# Patient Record
Sex: Female | Born: 1970
Health system: Southern US, Community
[De-identification: ages and names within clinical notes are randomized; demographics above are authoritative.]

## PROBLEM LIST (undated history)

## (undated) DIAGNOSIS — I10 Essential (primary) hypertension: Secondary | ICD-10-CM

## (undated) HISTORY — DX: Essential (primary) hypertension: I10

---

## 2007-03-16 ENCOUNTER — Emergency Department (HOSPITAL_COMMUNITY): Admission: EM | Admit: 2007-03-16 | Discharge: 2007-03-16 | Payer: Self-pay | Admitting: Family Medicine

## 2009-06-09 ENCOUNTER — Emergency Department (HOSPITAL_COMMUNITY)
Admission: EM | Admit: 2009-06-09 | Discharge: 2009-06-09 | Payer: Self-pay | Source: Home / Self Care | Admitting: Family Medicine

## 2009-06-11 ENCOUNTER — Emergency Department (HOSPITAL_COMMUNITY)
Admission: EM | Admit: 2009-06-11 | Discharge: 2009-06-11 | Payer: Self-pay | Source: Home / Self Care | Admitting: Emergency Medicine

## 2009-06-24 ENCOUNTER — Emergency Department (HOSPITAL_COMMUNITY)
Admission: EM | Admit: 2009-06-24 | Discharge: 2009-06-24 | Payer: Self-pay | Source: Home / Self Care | Admitting: Family Medicine

## 2009-12-11 ENCOUNTER — Encounter: Admission: RE | Admit: 2009-12-11 | Discharge: 2009-12-11 | Payer: Self-pay | Admitting: Internal Medicine

## 2011-02-02 LAB — POCT URINALYSIS DIP (DEVICE)
Nitrite: NEGATIVE
Operator id: 239701
Specific Gravity, Urine: 1.02

## 2011-02-02 LAB — DIFFERENTIAL
Eosinophils Absolute: 0.2
Lymphocytes Relative: 17
Monocytes Relative: 5
Neutrophils Relative %: 76

## 2011-02-02 LAB — POCT PREGNANCY, URINE: Preg Test, Ur: NEGATIVE

## 2011-02-02 LAB — CBC
Platelets: 201
WBC: 10.9 — ABNORMAL HIGH

## 2011-05-02 ENCOUNTER — Emergency Department (HOSPITAL_COMMUNITY)
Admission: EM | Admit: 2011-05-02 | Discharge: 2011-05-02 | Disposition: A | Discharge status: Home / Self Care | Payer: No Typology Code available for payment source | Attending: Emergency Medicine | Admitting: Emergency Medicine

## 2011-05-02 ENCOUNTER — Emergency Department (HOSPITAL_COMMUNITY): Payer: No Typology Code available for payment source

## 2011-05-02 ENCOUNTER — Encounter: Payer: Self-pay | Admitting: Emergency Medicine

## 2011-05-02 DIAGNOSIS — Y92410 Unspecified street and highway as the place of occurrence of the external cause: Secondary | ICD-10-CM | POA: Insufficient documentation

## 2011-05-02 DIAGNOSIS — S0003XA Contusion of scalp, initial encounter: Secondary | ICD-10-CM | POA: Insufficient documentation

## 2011-05-02 DIAGNOSIS — M542 Cervicalgia: Secondary | ICD-10-CM | POA: Insufficient documentation

## 2011-05-02 DIAGNOSIS — S0083XA Contusion of other part of head, initial encounter: Secondary | ICD-10-CM

## 2011-05-02 DIAGNOSIS — S0990XA Unspecified injury of head, initial encounter: Secondary | ICD-10-CM | POA: Insufficient documentation

## 2011-05-02 DIAGNOSIS — T07XXXA Unspecified multiple injuries, initial encounter: Secondary | ICD-10-CM | POA: Insufficient documentation

## 2011-05-02 DIAGNOSIS — S01409A Unspecified open wound of unspecified cheek and temporomandibular area, initial encounter: Secondary | ICD-10-CM | POA: Insufficient documentation

## 2011-05-02 MED ORDER — TETANUS-DIPHTH-ACELL PERTUSSIS 5-2.5-18.5 LF-MCG/0.5 IM SUSP
0.5000 mL | Freq: Once | INTRAMUSCULAR | Status: AC
  Administered 2011-05-02: 0.5 mL via INTRAMUSCULAR
  Filled 2011-05-02: qty 0.5

## 2011-05-02 MED ORDER — OXYCODONE-ACETAMINOPHEN 5-325 MG PO TABS: 2.0000 | ORAL_TABLET | Freq: Once | ORAL | Status: DC

## 2011-05-02 MED ORDER — LIDOCAINE-EPINEPHRINE 2 %-1:100000 IJ SOLN
20.0000 mL | Freq: Once | INTRAMUSCULAR | Status: AC
  Administered 2011-05-02: 20 mL via INTRADERMAL
  Filled 2011-05-02: qty 1

## 2011-05-02 MED ORDER — OXYCODONE-ACETAMINOPHEN 5-325 MG PO TABS
2.0000 | ORAL_TABLET | Freq: Once | ORAL | Status: AC
  Administered 2011-05-02: 2 via ORAL
  Filled 2011-05-02: qty 2

## 2011-05-02 MED ORDER — OXYCODONE-ACETAMINOPHEN 5-325 MG PO TABS: 1.0000 | ORAL_TABLET | ORAL | Status: DC | PRN

## 2011-05-02 MED ORDER — IBUPROFEN 200 MG PO TABS
400.0000 mg | ORAL_TABLET | Freq: Once | ORAL | Status: AC
  Administered 2011-05-02: 400 mg via ORAL
  Filled 2011-05-02: qty 2
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
## 2011-05-05 ENCOUNTER — Encounter (HOSPITAL_COMMUNITY): Payer: Self-pay | Admitting: *Deleted

## 2011-05-05 ENCOUNTER — Emergency Department (HOSPITAL_COMMUNITY)
Admission: EM | Admit: 2011-05-05 | Discharge: 2011-05-05 | Disposition: A | Discharge status: Home / Self Care | Payer: No Typology Code available for payment source | Attending: Emergency Medicine | Admitting: Emergency Medicine

## 2011-05-05 DIAGNOSIS — M549 Dorsalgia, unspecified: Secondary | ICD-10-CM | POA: Insufficient documentation

## 2011-05-05 MED ORDER — ONDANSETRON 4 MG PO TBDP
4.0000 mg | ORAL_TABLET | Freq: Once | ORAL | Status: AC
  Administered 2011-05-05: 4 mg via ORAL
  Filled 2011-05-05: qty 1

## 2011-05-05 MED ORDER — OXYCODONE-ACETAMINOPHEN 5-325 MG PO TABS: 2.0000 | ORAL_TABLET | ORAL | Status: AC | PRN

## 2011-05-05 MED ORDER — MORPHINE SULFATE 4 MG/ML IJ SOLN
4.0000 mg | Freq: Once | INTRAMUSCULAR | Status: AC
  Administered 2011-05-05: 4 mg via INTRAMUSCULAR
  Filled 2011-05-05: qty 1
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
## 2011-05-08 ENCOUNTER — Emergency Department (HOSPITAL_COMMUNITY)
Admission: EM | Admit: 2011-05-08 | Discharge: 2011-05-08 | Disposition: A | Discharge status: Home / Self Care | Payer: No Typology Code available for payment source | Attending: Emergency Medicine | Admitting: Emergency Medicine

## 2011-05-08 ENCOUNTER — Encounter (HOSPITAL_COMMUNITY): Payer: Self-pay

## 2011-05-08 DIAGNOSIS — Z4802 Encounter for removal of sutures: Secondary | ICD-10-CM

## 2011-05-08 MED ORDER — BACITRACIN-NEOMYCIN-POLYMYXIN 400-5-5000 EX OINT
TOPICAL_OINTMENT | CUTANEOUS | Status: AC
  Filled 2011-05-08: qty 1

## 2011-05-08 MED ORDER — IBUPROFEN 800 MG PO TABS: 800.0000 mg | ORAL_TABLET | Freq: Three times a day (TID) | ORAL | Status: AC

## 2011-05-08 NOTE — ED Provider Notes (Signed)
History     CSN: [id number]  Arrival date & time 05/08/11  1038   First MD Initiated Contact with Patient 05/08/11 1040      No chief complaint on file.   (Consider location/radiation/quality/duration/timing/severity/associated sxs/prior treatment) Patient is a 41 y.o. female presenting with suture removal. The history is provided by the patient.  Suture / Staple Removal  Treated in ED: 6 days ago. There has been no drainage from the wound. There is no redness present. There is no swelling present. The pain has no pain.    No past medical history on file.  No past surgical history on file.  No family history on file.  History  Substance Use Topics  . Smoking status: Not on file  . Smokeless tobacco: Not on file  . Alcohol Use:     OB History    Grav Para Term Preterm Abortions TAB SAB Ect Mult Living                  Review of Systems  Constitutional: Negative for fever and chills.    Allergies  Review of patient's allergies indicates no known allergies.  Home Medications   Current Outpatient Rx  Name Route Sig Dispense Refill  . OXYCODONE-ACETAMINOPHEN 5-325 MG PO TABS Oral Take 1 tablet by mouth every 4 (four) hours as needed for pain. 10 tablet 0  . OXYCODONE-ACETAMINOPHEN 5-325 MG PO TABS Oral Take 2 tablets by mouth every 4 (four) hours as needed for pain. 15 tablet 0    BP 111/74  Pulse 89  Temp(Src) 98.8 F (37.1 C) (Oral)  Resp 18  SpO2 100%  Physical Exam  Constitutional: She is oriented to person, place, and time. She appears well-developed and well-nourished. No distress.  HENT:  Head: Normocephalic.    Neck: Normal range of motion. Neck supple.  Cardiovascular: Normal rate, regular rhythm and normal heart sounds.   Pulmonary/Chest: Effort normal and breath sounds normal.  Neurological: She is alert and oriented to person, place, and time.  Skin: Skin is warm and dry. She is not diaphoretic.  Psychiatric: She has a normal mood and  affect.    ED Course  Procedures (including critical care time)  Labs Reviewed - No data to display No results found.   1. Visit for suture removal       MDM  Sutures removed.  No signs of infection.  No fevers.  Patient instructed to apply antibiotic ointment to the area.        Pascal Lux [facility] 05/08/11 1643
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
## 2011-05-09 NOTE — ED Provider Notes (Signed)
Medical screening examination/treatment/procedure(s) were performed by non-physician practitioner and as supervising physician I was immediately available for consultation/collaboration.   Suzi Roots, MD 05/09/11 [phone]

## 2011-05-10 ENCOUNTER — Emergency Department (HOSPITAL_COMMUNITY): Payer: No Typology Code available for payment source

## 2011-05-10 ENCOUNTER — Encounter (HOSPITAL_COMMUNITY): Payer: Self-pay | Admitting: Emergency Medicine

## 2011-05-10 ENCOUNTER — Emergency Department (HOSPITAL_COMMUNITY)
Admission: EM | Admit: 2011-05-10 | Discharge: 2011-05-10 | Disposition: A | Discharge status: Home / Self Care | Payer: No Typology Code available for payment source | Attending: Emergency Medicine | Admitting: Emergency Medicine

## 2011-05-10 DIAGNOSIS — R51 Headache: Secondary | ICD-10-CM

## 2011-05-10 DIAGNOSIS — IMO0002 Reserved for concepts with insufficient information to code with codable children: Secondary | ICD-10-CM | POA: Insufficient documentation

## 2011-05-10 DIAGNOSIS — S39012A Strain of muscle, fascia and tendon of lower back, initial encounter: Secondary | ICD-10-CM

## 2011-05-10 DIAGNOSIS — T1490XA Injury, unspecified, initial encounter: Secondary | ICD-10-CM | POA: Insufficient documentation

## 2011-05-10 MED ORDER — TRAMADOL HCL 50 MG PO TABS: 50.0000 mg | ORAL_TABLET | Freq: Four times a day (QID) | ORAL | Status: AC | PRN
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
## 2013-04-10 ENCOUNTER — Ambulatory Visit (INDEPENDENT_AMBULATORY_CARE_PROVIDER_SITE_OTHER): Payer: BC Managed Care – PPO | Admitting: Family Medicine

## 2013-04-10 VITALS — BP 120/84 | HR 86 | Temp 98.2°F | Resp 18 | Ht 58.5 in | Wt 110.0 lb

## 2013-04-10 DIAGNOSIS — Z609 Problem related to social environment, unspecified: Secondary | ICD-10-CM

## 2013-04-10 DIAGNOSIS — Z789 Other specified health status: Secondary | ICD-10-CM

## 2013-04-10 DIAGNOSIS — Z758 Other problems related to medical facilities and other health care: Secondary | ICD-10-CM | POA: Insufficient documentation

## 2013-04-10 DIAGNOSIS — R5381 Other malaise: Secondary | ICD-10-CM

## 2013-04-10 DIAGNOSIS — R51 Headache: Secondary | ICD-10-CM

## 2013-04-10 LAB — POCT CBC
Granulocyte percent: 60.5 %G (ref 37–80)
Lymph, poc: 1.9 (ref 0.6–3.4)
MCV: 94.9 fL (ref 80–97)
MID (cbc): 0.4 (ref 0–0.9)
MPV: 10.8 fL (ref 0–99.8)
POC Granulocyte: 3.5 (ref 2–6.9)
RBC: 4.64 M/uL (ref 4.04–5.48)
WBC: 5.8 10*3/uL (ref 4.6–10.2)

## 2013-04-10 LAB — POCT UA - MICROSCOPIC ONLY
Casts, Ur, LPF, POC: NEGATIVE
RBC, urine, microscopic: NEGATIVE
Yeast, UA: NEGATIVE

## 2013-04-10 LAB — POCT URINALYSIS DIPSTICK
Blood, UA: NEGATIVE
Ketones, UA: NEGATIVE
Leukocytes, UA: NEGATIVE
Protein, UA: NEGATIVE
Urobilinogen, UA: 0.2

## 2013-04-10 LAB — POCT URINE PREGNANCY: Preg Test, Ur: NEGATIVE

## 2013-04-10 LAB — POCT GLYCOSYLATED HEMOGLOBIN (HGB A1C): Hemoglobin A1C: 4.7

## 2013-04-10 LAB — POCT INFLUENZA A/B: Influenza A, POC: NEGATIVE

## 2013-04-10 NOTE — Progress Notes (Signed)
[facility] [address] [place] [address] [phone]  Date:  04/10/2013   Name:  Molly Santos   DOB:  1970-11-19   MRN:  [id number]  PCP:  No primary provider on file.    Chief Complaint: Generalized Body Aches, Dizziness and Nausea   History of Present Illness:  Molly Santos is a 42 y.o. very pleasant female patient who presents with the following:  [place] lady here today with illness.  Complaint of HA, dizziness and body aches.  Sx started this am.   She does not note a ST or earache, no cough.   She has felt hot.  She also feels tired.  She feels "like she is sick", she has body aches and feels hot and cold.  She has felt nauseated but not vomited.  No diarrhea.   She has been able to eat.   Her daughter interprets for [place] today- states that her mother often has headaches.  However she does not have any chronic health problems that they know of.    She will take advil some times, but none so far today.   No sick contacts  LMP about 10 days ago  There are no active problems to display for this patient.   No past medical history on file.  No past surgical history on file.  History  Substance Use Topics  . Smoking status: Never Smoker   . Smokeless tobacco: Not on file  . Alcohol Use: Yes    No family history on file.  Allergies  Allergen Reactions  . Oxycodone Other (See Comments)    Gave her a headache    Medication list has been reviewed and updated.  No current outpatient prescriptions on file prior to visit.   No current facility-administered medications on file prior to visit.    Review of Systems:  As per HPI- otherwise negative.   Physical Examination: Filed Vitals:   04/10/13 1152  BP: 120/84  Pulse: 86  Temp: 98.2 F (36.8 C)  Resp: 18   Filed Vitals:   04/10/13 1152  Height: 4' 10.5" (1.486 m)  Weight: 110 lb (49.896 kg)   Body mass index is 22.6 kg/(m^2). Ideal Body Weight: Weight in (lb) to have BMI  = 25: 121.4  GEN: WDWN, NAD, Non-toxic, A & O x 3, looks well, healthy weight HEENT: Atraumatic, Normocephalic. Neck supple. No masses, No LAD.  Bilateral TM wnl, oropharynx normal.  PEERL,EOMI.   Ears and Nose: No external deformity. CV: RRR, No M/G/R. No JVD. No thrill. No extra heart sounds. PULM: CTA B, no wheezes, crackles, rhonchi. No retractions. No resp. distress. No accessory muscle use. ABD: S, NT, ND. No rebound. No HSM. EXTR: No c/c/e NEURO Normal gait. Normal strength, sensation and DTR all extremities.   PSYCH: Normally interactive. Conversant. Not depressed or anxious appearing.  Calm demeanor.   Results for orders placed in visit on 04/10/13  POCT CBC      Result Value Range   WBC 5.8  4.6 - 10.2 K/uL   Lymph, poc 1.9  0.6 - 3.4   POC LYMPH PERCENT 33.4  10 - 50 %L   MID (cbc) 0.4  0 - 0.9   POC MID % 6.1  0 - 12 %M   POC Granulocyte 3.5  2 - 6.9   Granulocyte percent 60.5  37 - 80 %G   RBC 4.64  4.04 - 5.48 M/uL   Hemoglobin 13.8  12.2 -  16.2 g/dL   HCT, POC 16.1  09.6 - 47.9 %   MCV 94.9  80 - 97 fL   MCH, POC 29.7  27 - 31.2 pg   MCHC 31.4 (*) 31.8 - 35.4 g/dL   RDW, POC 04.5     Platelet Count, POC 182  142 - 424 K/uL   MPV 10.8  0 - 99.8 fL  POCT INFLUENZA A/B      Result Value Range   Influenza A, POC Negative     Influenza B, POC Negative    POCT URINE PREGNANCY      Result Value Range   Preg Test, Ur Negative    POCT UA - MICROSCOPIC ONLY      Result Value Range   WBC, Ur, HPF, POC 0-4     RBC, urine, microscopic neg     Bacteria, U Microscopic trace     Mucus, UA neg     Epithelial cells, urine per micros 4-8     Crystals, Ur, HPF, POC neg     Casts, Ur, LPF, POC neg     Yeast, UA neg    POCT URINALYSIS DIPSTICK      Result Value Range   Color, UA yellow     Clarity, UA cloudy     Glucose, UA 100     Bilirubin, UA neg     Ketones, UA neg     Spec Grav, UA 1.015     Blood, UA neg     pH, UA 8.5     Protein, UA neg     Urobilinogen, UA  0.2     Nitrite, UA neg     Leukocytes, UA Negative    GLUCOSE, POCT (MANUAL RESULT ENTRY)      Result Value Range   POC Glucose 66 (*) 70 - 99 mg/dl  POCT GLYCOSYLATED HEMOGLOBIN (HGB A1C)      Result Value Range   Hemoglobin A1C 4.7     Glucose noted to be slightly low- she is not driving home, her husband is.  Given juice to drink.  Advised not to drive until she has a full meal   Assessment and Plan: Other malaise and fatigue - Plan: POCT CBC, POCT Influenza A/B, POCT urine pregnancy, POCT UA - Microscopic Only, POCT urinalysis dipstick, POCT glucose (manual entry), POCT glycosylated hemoglobin (Hb A1C)  Headache(784.0)  Language barrier  Likely viral URI.  She had a trace of glucose on her UA but blood glucose is normal.    Signed Abbe Amsterdam, MD

## 2013-04-10 NOTE — Patient Instructions (Signed)
It looks like you have a cold.  Rest, drink fluids and take ibuprofen or aleve as needed.  Let me know it you are not better in the next couple of days.

## 2013-07-08 ENCOUNTER — Ambulatory Visit (INDEPENDENT_AMBULATORY_CARE_PROVIDER_SITE_OTHER): Payer: BC Managed Care – PPO | Admitting: Emergency Medicine

## 2013-07-08 VITALS — BP 118/74 | HR 88 | Temp 98.2°F | Resp 18 | Ht <= 58 in | Wt 110.0 lb

## 2013-07-08 DIAGNOSIS — J209 Acute bronchitis, unspecified: Secondary | ICD-10-CM

## 2013-07-08 DIAGNOSIS — J029 Acute pharyngitis, unspecified: Secondary | ICD-10-CM

## 2013-07-08 MED ORDER — GUAIFENESIN-DM 100-10 MG/5ML PO SYRP: 5.0000 mL | ORAL_SOLUTION | ORAL | Status: DC | PRN

## 2013-07-08 MED ORDER — AZITHROMYCIN 250 MG PO TABS: ORAL_TABLET | ORAL | Status: DC

## 2013-07-08 NOTE — Patient Instructions (Signed)
Vim ph? qu?n (Bronchitis) Vim ph? qu?n l vim ???ng th? t? kh qu?n vo ph?i (ph? qu?n). Tnh tr?ng vim th??ng gy s?n sinh d?ch nh?y v d?n ??n ho. N?u vim tr? nn n?ng h?n, n c th? gy ra kh th?. NGUYN NHN.  Vim ph? qu?n c th? do:   Nhi?m vi rt  Vi khu?n.  Khi thu?c l.  D? ?ng nguyn, ch?t gy  nhi?m v cc ch?t gy kch ?ng khc. D?U HI?U V TRI?U CH?NG  Tri?u ch?ng ph? bi?n nh?t c?a vim ph? qu?n l ho th??ng xuyn ra d?ch nh?y. Cc tri?u ch?ng khc bao g?m:  S?t.  ?au nh?c ton thn.  T?c ng?c.  ?n l?nh.  Kh th?.  ?au h?ng. CH?N ?ON  Vim ph? qu?n th??ng ???c ch?n ?on thng qua b?nh s? v khm th?c th?. Nh?ng ki?m tra nh? ch?p X-quang ng?c ??i khi ???c th?c hi?n ?? lo?i tr? cc b?nh l khc.  ?I?U TR?  Qu v? c th? c?n trnh ti?p xc v?i t?t c? nh?ng g gy b?nh (v d? nh? khi thu?c). ?i khi c?n ph?i dng thu?c. Nh?ng thu?c ny c th? bao g?m:  Khng sinh. Thu?c khng sinh c th? ???c k ??n n?u tnh tr?ng b?nh do vi khu?n gy ra.  Thu?c gi?m ho. Nh?ng thu?c ny c th? ???c k ??n ?? gi?m tri?u ch?ng ho.  Thu?c d?ng ht. Nh?ng thu?c ny c th? ???c k ??n ?? gip khai thng ???ng th? v lm cho qu v? d? th? h?n.  Cc lo?i thu?c steroid. Nh?ng lo?i thu?c ny c th? ???c k ??n cho nh?ng ng??i b? vim ph? qu?n ti pht (m?n tnh) H??NG D?N CH?M SC T?I NH  Ngh? ng?i th?t nhi?u.  U?ng ?? n??c ?? n??c ti?u lun trong ho?c mu vng nh?t (tr? khi qu v? c b?nh l c?n ph?i h?n ch? u?ng n??c). T?ng l??ng n??c c th? gip lm l?ng ??m v s? ng?n ng?a m?t n??c.  Ch? s? d?ng thu?c khng c?n k ??n ho?c thu?c c?n k ??n theo ch? d?n c?a chuyn gia ch?m sc s?c kh?e.  Ch? dng thu?c khng sinh theo ch? d?n. B?o ??m vi?c qu v? dng h?t thu?c ngay c? khi qu v? b?t ??u c?m th?y ?? h?n.  Trnh khi thu?c th? ??ng, cc ch?t gy kch ?ng, v lu?ng khi m?nh. Nh?ng th? ny lm vim ph? qu?n n?ng h?n. N?u qu v? ht thu?c, hy b? ht thu?c. Cn nh?c s? d?ng  k?o cao su c nicotine ho?c mi?ng dn trn da ?? gip ki?m sot cc tri?u ch?ng sau cai. B? ht thu?c s? gip ph?i bnh ph?c nhanh h?n.  ?? m?t chi?c my t?o s??ng mt trong phng ng? vo ban ?m ?? lm ?m khng kh. Vi?c ny gip lm l?ng d?ch nh?y. Thay n??c trong my t?o s??ng hng ngy. Qu v? c?ng c th? cho n??c nng khi t?m vi hoa sen v ng?i trong phng t?m ?ng kn c?a trong 5-10 pht.  G?p chuyn gia ch?m sc s?c kh?e ?? khm l?i theo ch? d?n.  R?a tay th??ng xuyn ?? trnh m?c l?i b?nh vim ph? qu?n ho?c ly b?nh cho ng??i khc. ?I KHM N?U: Cc tri?u ch?ng c?a qu v? khng gi?m b?t sau 1 tu?n ?i?u tr?.  NGAY L?P T?C ?I KHM N?U:  C?n s?t c?a qu v? t?ng ln.  Qu v? b? ?n l?nh.  Qu v? b? ?au ng?c.  Qu v? b? kh   th? h?n.  Qu v? c ??m l?n mu.  Qu v? b? ng?t.  Qu v? b? chong vng.  Qu v? b? ?au ??u n?ng.  Qu v? nn nhi?u l?n. ??M B?O QU V?:   Hi?u r cc h??ng d?n ny.  S? theo di tnh tr?ng c?a mnh.  S? yu c?u tr? gip ngay l?p t?c n?u qu v? c?m th?y khng kh?e ho?c th?y tr?m tr?ng h?n. Document Released: 04/12/2005 Document Revised: 01/31/2013 ExitCare Patient Information 2014 [employer].  

## 2013-07-08 NOTE — Progress Notes (Signed)
[facility] [address], [place] [address] [phone]  Date:  07/08/2013   Name:  Molly Santos   DOB:  11/27/1970   MRN:  [id number]  PCP:  No PCP Per Patient    Chief Complaint: Sore Throat and Cough   History of Present Illness:  Molly Santos is a 43 y.o. very pleasant female patient who presents with the following:  Ill since yesterday.  She has a non productive cough with no wheezing or shortness of breath.  She has no nasal congestion or drainage.  No fever or chills.  No nausea or vomiting. No rash. Has a sore throat that interferes with her ability to swallow.  Has no sick contacts. No improvement with over the counter medications or other home remedies. Denies other complaint or health concern today.   Patient Active Problem List   Diagnosis Date Noted  . Language barrier 04/10/2013    History reviewed. No pertinent past medical history.  History reviewed. No pertinent past surgical history.  History  Substance Use Topics  . Smoking status: Never Smoker   . Smokeless tobacco: Not on file  . Alcohol Use: Yes    No family history on file.  Allergies  Allergen Reactions  . Oxycodone Other (See Comments)    Gave her a headache    Medication list has been reviewed and updated.  No current outpatient prescriptions on file prior to visit.   No current facility-administered medications on file prior to visit.    Review of Systems:  As per HPI, otherwise negative.    Physical Examination: Filed Vitals:   07/08/13 1745  BP: 118/74  Pulse: 88  Temp: 98.2 F (36.8 C)  Resp: 18   Filed Vitals:   07/08/13 1745  Height: 4\' 9"  (1.448 m)  Weight: 110 lb (49.896 kg)   Body mass index is 23.8 kg/(m^2). Ideal Body Weight: Weight in (lb) to have BMI = 25: 115.3  GEN: WDWN, NAD, Non-toxic, A & O x 3 HEENT: Atraumatic, Normocephalic. Neck supple. No masses, No LAD. Ears and Nose: No external deformity. CV: RRR, No M/G/R. No JVD. No thrill.  No extra heart sounds. PULM: CTA B, no wheezes, crackles, rhonchi. No retractions. No resp. distress. No accessory muscle use. ABD: S, NT, ND, +BS. No rebound. No HSM. EXTR: No c/c/e NEURO Normal gait.  PSYCH: Normally interactive. Conversant. Not depressed or anxious appearing.  Calm demeanor.    Assessment and Plan: Bronchitis Pharyngitis zpak Robitussin dm  Signed,  Phillips OdorJeffery Sina Sumpter, MD

## 2014-11-14 ENCOUNTER — Ambulatory Visit (INDEPENDENT_AMBULATORY_CARE_PROVIDER_SITE_OTHER): Payer: BLUE CROSS/BLUE SHIELD | Admitting: Family Medicine

## 2014-11-14 VITALS — BP 102/70 | HR 92 | Temp 98.1°F | Resp 16 | Ht <= 58 in | Wt 109.0 lb

## 2014-11-14 DIAGNOSIS — R110 Nausea: Secondary | ICD-10-CM | POA: Diagnosis not present

## 2014-11-14 DIAGNOSIS — M791 Myalgia, unspecified site: Secondary | ICD-10-CM

## 2014-11-14 LAB — POCT URINALYSIS DIPSTICK
Glucose, UA: NEGATIVE
Ketones, UA: 40
Leukocytes, UA: NEGATIVE
Nitrite, UA: NEGATIVE
Protein, UA: 30
Spec Grav, UA: 1.025
Urobilinogen, UA: 0.2
pH, UA: 6

## 2014-11-14 LAB — POCT CBC
Granulocyte percent: 51.6 %G (ref 37–80)
HCT, POC: 38.1 % (ref 37.7–47.9)
Hemoglobin: 12.8 g/dL (ref 12.2–16.2)
Lymph, poc: 1.7 (ref 0.6–3.4)
MCH, POC: 29.3 pg (ref 27–31.2)
MCHC: 33.6 g/dL (ref 31.8–35.4)
MCV: 87.3 fL (ref 80–97)
MID (cbc): 0.2 (ref 0–0.9)
MPV: 8.3 fL (ref 0–99.8)
POC Granulocyte: 2 (ref 2–6.9)
POC LYMPH PERCENT: 44.4 %L (ref 10–50)
POC MID %: 4 %M (ref 0–12)
Platelet Count, POC: 200 10*3/uL (ref 142–424)
RBC: 4.37 M/uL (ref 4.04–5.48)
RDW, POC: 13.4 %
WBC: 3.8 10*3/uL — AB (ref 4.6–10.2)

## 2014-11-14 LAB — POCT UA - MICROSCOPIC ONLY
Casts, Ur, LPF, POC: NEGATIVE
Crystals, Ur, HPF, POC: NEGATIVE
Mucus, UA: POSITIVE
Yeast, UA: NEGATIVE

## 2014-11-14 MED ORDER — ONDANSETRON 8 MG PO TBDP: 8.0000 mg | ORAL_TABLET | Freq: Three times a day (TID) | ORAL | Status: DC | PRN

## 2014-11-14 NOTE — Patient Instructions (Signed)

## 2014-11-14 NOTE — Progress Notes (Addendum)
This chart was scribed for Molly Santos, Molly Santos by Molly Santos, medical scribe at [facility].The patient was seen in exam room 8 and the patient's care was started at 2:43 PM.  Patient ID: Molly Santos MRN: [id number], DOB: 01-18-1971, 44 y.o. Date of Encounter: 11/14/2014  Primary Physician: No PCP Per Patient  Chief Complaint:  Chief Complaint  Patient presents with   Headache    x 2 days   Fatigue    x 2 days   stiffness    x 2 days   Nausea    x 2 days    HPI:  Molly Santos is a 44 y.o. female who [facility] complaining of headache with associated fatigue and nausea for the past 3 days. She also notes feeling aches all over her body.  She feels some abdominal pain. She is currently not on medication for any illness.  She denies any fever, vomiting, cough, or diarrhea.   She works in a factory.  She is here with her daughter, and was translated by her daughter.   History reviewed. No pertinent past medical history.   Home Meds: Prior to Admission medications   Medication Sig Start Date End Date Taking? Authorizing Provider  azithromycin (ZITHROMAX) 250 MG tablet Take 2 tabs PO x 1 dose, then 1 tab PO QD x 4 days Patient not taking: Reported on 11/14/2014 07/08/13   Carmelina Dane, Molly Santos  guaiFENesin-dextromethorphan (ROBITUSSIN DM) 100-10 MG/5ML syrup Take 5 mLs by mouth every 4 (four) hours as needed for cough. Patient not taking: Reported on 11/14/2014 07/08/13   Carmelina Dane, Molly Santos    Allergies:  Allergies  Allergen Reactions   Oxycodone Other (See Comments)    Gave her a headache    History   Social History   Marital Status: Married    Spouse Name: N/A   Number of Children: N/A   Years of Education: N/A   Occupational History   Not on file.   Social History Main Topics   Smoking status: Never Smoker    Smokeless tobacco: Not on file   Alcohol Use: Yes   Drug Use: No   Sexual Activity: Not  on file   Other Topics Concern   Not on file   Social History Narrative     Review of Systems: Constitutional: negative for chills, fever, night sweats, or weight changes; positive for fatigue  HEENT: negative for vision changes, hearing loss, congestion, rhinorrhea, ST, epistaxis, or sinus pressure Cardiovascular: negative for chest pain or palpitations Respiratory: negative for hemoptysis, wheezing, shortness of breath, or cough Abdominal: negative for vomiting, diarrhea, or constipation; positive for some abdominal pain, positive for nausea Dermatological: negative for rash Neurologic: negative for dizziness, or syncope; positive for headache Musc: positive for body aches All other systems reviewed and are otherwise negative with the exception to those above and in the HPI.  Physical Exam: Blood pressure 102/70, pulse 92, temperature 98.1 F (36.7 C), temperature source Oral, resp. rate 16, height 4\' 10"  (1.473 m), weight 109 lb (49.442 kg), last menstrual period 11/10/2014, SpO2 99 %., Body mass index is 22.79 kg/(m^2). General: Well developed, well nourished, in no acute distress. Head: Normocephalic, atraumatic, eyes without discharge, sclera non-icteric, nares are without discharge. Bilateral auditory canals clear, TM's are without perforation, pearly grey and translucent with reflective cone of light bilaterally. Oral cavity moist, posterior pharynx without exudate, erythema, peritonsillar abscess, or post nasal drip.  Neck:  Supple. No thyromegaly. Full ROM. No lymphadenopathy. Lungs: Clear bilaterally to auscultation without wheezes, rales, or rhonchi. Breathing is unlabored. Heart: RRR with S1 S2. No murmurs, rubs, or gallops appreciated. Abdomen: Soft, non-distended with normoactive bowel sounds. No hepatomegaly. No obvious abdominal masses. abdominal tenderrness in LLQ without rebounding or guarding, no HSM Msk:  Strength and tone normal for age. Extremities/Skin: Warm and  dry. No clubbing or cyanosis. No edema. No rashes or suspicious lesions. Neuro: Alert and oriented X 3. Moves all extremities spontaneously. Gait is normal. CNII-XII grossly in tact. Psych:  Responds to questions appropriately with a normal affect.   Labs: Results for orders placed or performed in visit on 11/14/14  POCT CBC  Result Value Ref Range   WBC 3.8 (A) 4.6 - 10.2 K/uL   Lymph, poc 1.7 0.6 - 3.4   POC LYMPH PERCENT 44.4 10 - 50 %L   MID (cbc) 0.2 0 - 0.9   POC MID % 4.0 0 - 12 %M   POC Granulocyte 2.0 2 - 6.9   Granulocyte percent 51.6 37 - 80 %G   RBC 4.37 4.04 - 5.48 M/uL   Hemoglobin 12.8 12.2 - 16.2 g/dL   HCT, POC 16.1 09.6 - 47.9 %   MCV 87.3 80 - 97 fL   MCH, POC 29.3 27 - 31.2 pg   MCHC 33.6 31.8 - 35.4 g/dL   RDW, POC 04.5 %   Platelet Count, POC 200 142 - 424 K/uL   MPV 8.3 0 - 99.8 fL  POCT urinalysis dipstick  Result Value Ref Range   Color, UA yellow    Clarity, UA clear    Glucose, UA neg    Bilirubin, UA small    Ketones, UA 40    Spec Grav, UA 1.025    Blood, UA trace    pH, UA 6.0    Protein, UA 30    Urobilinogen, UA 0.2    Nitrite, UA neg    Leukocytes, UA Negative Negative  POCT UA - Microscopic Only  Result Value Ref Range   WBC, Ur, HPF, POC 2-5    RBC, urine, microscopic 1-2    Bacteria, U Microscopic few    Mucus, UA positive    Epithelial cells, urine per micros 2-4    Crystals, Ur, HPF, POC neg    Casts, Ur, LPF, POC neg    Yeast, UA neg      ASSESSMENT AND PLAN:  44 y.o. year old female with what appears to be a viral illness. This chart was scribed in my presence and reviewed by me personally.    ICD-9-CM ICD-10-CM   1. Nausea without vomiting 787.02 R11.0 POCT CBC     POCT urinalysis dipstick     POCT UA - Microscopic Only     ondansetron (ZOFRAN ODT) 8 MG disintegrating tablet  2. Myalgia 729.1 M79.1 ondansetron (ZOFRAN ODT) 8 MG disintegrating tablet    Signed, Molly Santos, Molly Santos 11/14/2014 3:44 PM

## 2015-08-13 ENCOUNTER — Ambulatory Visit (INDEPENDENT_AMBULATORY_CARE_PROVIDER_SITE_OTHER): Payer: Self-pay | Admitting: Urgent Care

## 2015-08-13 VITALS — BP 159/99 | HR 69 | Temp 97.9°F | Resp 16 | Ht <= 58 in | Wt 118.8 lb

## 2015-08-13 DIAGNOSIS — N3090 Cystitis, unspecified without hematuria: Secondary | ICD-10-CM

## 2015-08-13 DIAGNOSIS — R809 Proteinuria, unspecified: Secondary | ICD-10-CM

## 2015-08-13 DIAGNOSIS — R319 Hematuria, unspecified: Secondary | ICD-10-CM

## 2015-08-13 DIAGNOSIS — R300 Dysuria: Secondary | ICD-10-CM

## 2015-08-13 LAB — POC MICROSCOPIC URINALYSIS (UMFC): MUCUS RE: ABSENT

## 2015-08-13 LAB — POCT URINALYSIS DIP (MANUAL ENTRY)
BILIRUBIN UA: NEGATIVE
GLUCOSE UA: NEGATIVE
Ketones, POC UA: NEGATIVE
Nitrite, UA: NEGATIVE
Protein Ur, POC: >=300 — AB
SPEC GRAV UA: 1.02
Urobilinogen, UA: 0.2
pH, UA: 6

## 2015-08-13 MED ORDER — CIPROFLOXACIN HCL 500 MG PO TABS: 500.0000 mg | ORAL_TABLET | Freq: Two times a day (BID) | ORAL | Status: DC

## 2015-08-13 NOTE — Patient Instructions (Addendum)
 Urinary Tract Infection Urinary tract infections (UTIs) can develop anywhere along your urinary tract. Your urinary tract is your body's drainage system for removing wastes and extra water. Your urinary tract includes two kidneys, two ureters, a bladder, and a urethra. Your kidneys are a pair of bean-shaped organs. Each kidney is about the size of your fist. They are located below your ribs, one on each side of your spine. CAUSES Infections are caused by microbes, which are microscopic organisms, including fungi, viruses, and bacteria. These organisms are so small that they can only be seen through a microscope. Bacteria are the microbes that most commonly cause UTIs. SYMPTOMS  Symptoms of UTIs may vary by age and gender of the patient and by the location of the infection. Symptoms in young women typically include a frequent and intense urge to urinate and a painful, burning feeling in the bladder or urethra during urination. Older women and men are more likely to be tired, shaky, and weak and have muscle aches and abdominal pain. A fever may mean the infection is in your kidneys. Other symptoms of a kidney infection include pain in your back or sides below the ribs, nausea, and vomiting. DIAGNOSIS To diagnose a UTI, your caregiver will ask you about your symptoms. Your caregiver will also ask you to provide a urine sample. The urine sample will be tested for bacteria and white blood cells. White blood cells are made by your body to help fight infection. TREATMENT  Typically, UTIs can be treated with medication. Because most UTIs are caused by a bacterial infection, they usually can be treated with the use of antibiotics. The choice of antibiotic and length of treatment depend on your symptoms and the type of bacteria causing your infection. HOME CARE INSTRUCTIONS  If you were prescribed antibiotics, take them exactly as your caregiver instructs you. Finish the medication even if you feel better  after you have only taken some of the medication.  Drink enough water and fluids to keep your urine clear or pale yellow.  Avoid caffeine, tea, and carbonated beverages. They tend to irritate your bladder.  Empty your bladder often. Avoid holding urine for long periods of time.  Empty your bladder before and after sexual intercourse.  After a bowel movement, women should cleanse from front to back. Use each tissue only once. SEEK MEDICAL CARE IF:   You have back pain.  You develop a fever.  Your symptoms do not begin to resolve within 3 days. SEEK IMMEDIATE MEDICAL CARE IF:   You have severe back pain or lower abdominal pain.  You develop chills.  You have nausea or vomiting.  You have continued burning or discomfort with urination. MAKE SURE YOU:   Understand these instructions.  Will watch your condition.  Will get help right away if you are not doing well or get worse.   This information is not intended to replace advice given to you by your health care provider. Make sure you discuss any questions you have with your health care provider.   Document Released: 01/20/2005 Document Revised: 01/01/2015 Document Reviewed: 05/21/2011 Elsevier Interactive Patient Education 2016 Elsevier Inc.    IF you received an x-ray today, you will receive an invoice from [facility]. Please contact [facility] at [phone] with questions or concerns regarding your invoice.   IF you received labwork today, you will receive an invoice from [facility]. Please contact [facility] at [phone] with questions or concerns regarding your invoice.     Our billing staff will not be able to assist you with questions regarding bills from these companies.  You will be contacted with the lab results as soon as they are available. The fastest way to get your results is to activate your My Chart account. Instructions are located on the last page of this  paperwork. If you have not heard from us regarding the results in 2 weeks, please contact this office.     

## 2015-08-13 NOTE — Progress Notes (Signed)
    MRN: [id number] DOB: 01/08/1971  Subjective:   Molly Santos is a 45 y.o. female presenting for chief complaint of Dysuria  Reports 1 week history of dysuria, urinary frequency, urinary urgency, pelvic pain and cloudy malordorous urine. Has tried Cystex with minimal relief. Denies fever, hematuria, flank pain, abdominal pain, genital rash, genital irritation and vaginal discharge, nausea and vomiting.   Molly Santos has a current medication list which includes the following prescription(s): ondansetron. Patient is allergic to oxycodone.  Molly Santos  has a past medical history of Hypertension. Also  has no past surgical history on file.  Objective:   Vitals: BP 159/99 mmHg  Pulse 69  Temp(Src) 97.9 F (36.6 C) (Oral)  Resp 16  Ht 4\' 10"  (1.473 m)  Wt 118 lb 12.8 oz (53.887 kg)  BMI 24.84 kg/m2  SpO2 99%  LMP 07/29/2015  BP Readings from Last 3 Encounters:  08/13/15 159/99  11/14/14 102/70  07/08/13 118/74    Physical Exam  Constitutional: She is oriented to person, place, and time. She appears well-developed and well-nourished.  Cardiovascular: Normal rate, regular rhythm and intact distal pulses.  Exam reveals no gallop and no friction rub.   No murmur heard. Pulmonary/Chest: No respiratory distress. She has no wheezes. She has no rales.  Abdominal: Soft. Bowel sounds are normal. She exhibits no distension and no mass. There is no tenderness.  No CVA tenderness.  Neurological: She is alert and oriented to person, place, and time.  Skin: Skin is warm and dry.    Results for orders placed or performed in visit on 08/13/15 (from the past 24 hour(s))  POCT Microscopic Urinalysis ([facility])     Status: Abnormal   Collection Time: 08/13/15  8:41 AM  Result Value Ref Range   WBC,UR,HPF,POC Too numerous to count  (A) None WBC/hpf   RBC,UR,HPF,POC Moderate (A) None RBC/hpf   Bacteria Moderate (A) None, Too numerous to count   Mucus Absent Absent   Epithelial Cells, UR Per Microscopy Moderate (A)  None, Too numerous to count cells/hpf  POCT urinalysis dipstick     Status: Abnormal   Collection Time: 08/13/15  8:41 AM  Result Value Ref Range   Color, UA yellow yellow   Clarity, UA cloudy (A) clear   Glucose, UA negative negative   Bilirubin, UA negative negative   Ketones, POC UA negative negative   Spec Grav, UA 1.020    Blood, UA large (A) negative   pH, UA 6.0    Protein Ur, POC >=300 (A) negative   Urobilinogen, UA 0.2    Nitrite, UA Negative Negative   Leukocytes, UA large (3+) (A) Negative   Assessment and Plan :   1. Cystitis 2. Dysuria 3. Hematuria 4. Proteinuria - Start ciprofloxacin, advised aggressive hydration. Patient declined urine culture due to cost burden. She will rtc if no improvement in 3 days.  Wallis BambergMario Raihan Kimmel, PA-C [facility] [phone] 08/13/2015 8:39 AM

## 2015-11-14 ENCOUNTER — Emergency Department (HOSPITAL_COMMUNITY)
Admission: EM | Admit: 2015-11-14 | Discharge: 2015-11-15 | Disposition: A | Discharge status: Home / Self Care | Payer: No Typology Code available for payment source | Attending: Emergency Medicine | Admitting: Emergency Medicine

## 2015-11-14 ENCOUNTER — Encounter (HOSPITAL_COMMUNITY): Payer: Self-pay | Admitting: Emergency Medicine

## 2015-11-14 DIAGNOSIS — S40022A Contusion of left upper arm, initial encounter: Secondary | ICD-10-CM | POA: Insufficient documentation

## 2015-11-14 DIAGNOSIS — Y929 Unspecified place or not applicable: Secondary | ICD-10-CM | POA: Insufficient documentation

## 2015-11-14 DIAGNOSIS — I10 Essential (primary) hypertension: Secondary | ICD-10-CM | POA: Insufficient documentation

## 2015-11-14 DIAGNOSIS — S0083XA Contusion of other part of head, initial encounter: Secondary | ICD-10-CM | POA: Insufficient documentation

## 2015-11-14 DIAGNOSIS — W228XXA Striking against or struck by other objects, initial encounter: Secondary | ICD-10-CM | POA: Insufficient documentation

## 2015-11-14 DIAGNOSIS — Y9389 Activity, other specified: Secondary | ICD-10-CM | POA: Insufficient documentation

## 2015-11-14 DIAGNOSIS — Y990 Civilian activity done for income or pay: Secondary | ICD-10-CM | POA: Insufficient documentation

## 2015-11-14 MED ORDER — TETRACAINE HCL 0.5 % OP SOLN
1.0000 [drp] | Freq: Once | OPHTHALMIC | Status: AC
  Administered 2015-11-15: 1 [drp] via OPHTHALMIC
  Filled 2015-11-14: qty 4

## 2015-11-14 MED ORDER — FLUORESCEIN SODIUM 1 MG OP STRP
1.0000 | ORAL_STRIP | Freq: Once | OPHTHALMIC | Status: AC
  Administered 2015-11-15: 1 via OPHTHALMIC
  Filled 2015-11-14: qty 1

## 2015-11-14 NOTE — ED Notes (Signed)
Pt was hit with the hose of a machine today at work. She has a bruise from this on her left arm and has some swelling on her left cheek. Pt states she feels pressure in her left eye, and she thinks there may be something stuck in that eye, but pt denies changes in vision. Pt denies LOC.

## 2015-11-14 NOTE — ED Provider Notes (Signed)
CSN: [id number]     Arrival date & time 11/14/15  2123 History  By signing my name below, I, Molly Santos, attest that this documentation has been prepared under the direction and in the presence of Molly BerryLeisa Tyson Masin, PA-C. Electronically Signed: Bridgette HabermannMaria Santos, ED Scribe. 11/14/2015. 10:39 PM.   Chief Complaint  Patient presents with  . Facial Injury  . Arm Injury   The history is provided by the patient. No language interpreter was used.    HPI Comments: Molly Santos is a 45 y.o. female who presents to the Emergency Department complaining of left eye pain s/p mechanical injury 5:30 pm today. Pt reports she was hit with the hose of a machine when it got disconnected at work. It struck her left eye and left arm. No LOC. Pt notes she feels pain "deep" in her left eye, with soreness surrounding her eye as well.  Pain is rated moderate.  She also reports associated foreign body sensation. Discomfort is worse with eye movement and with palpation around her eye.  She denies photophobia. She reports associated swelling of her left cheek and also swelling and bruising of her left arm.  She reports initially her vision was blurry but it quickly resolved and she denies any visual disturbances currently. She denies any other injury, and has no other associated or acute symptoms.  No deformity, weakness, numbness or tingling.    Past Medical History  Diagnosis Date  . Hypertension    History reviewed. No pertinent past surgical history. No family history on file. Social History  Substance Use Topics  . Smoking status: Never Smoker   . Smokeless tobacco: None  . Alcohol Use: No   OB History    No data available     Review of Systems  Eyes: Positive for pain. Negative for photophobia, discharge, redness, itching and visual disturbance.  Musculoskeletal: Positive for myalgias. Negative for back pain, joint swelling, arthralgias and neck pain.  Skin: Negative for wound.  Neurological: Negative for dizziness,  syncope, weakness, light-headedness, numbness and headaches.  All other systems reviewed and are negative.     Allergies  Oxycodone  Home Medications   Prior to Admission medications   Medication Sig Start Date End Date Taking? Authorizing Provider  ciprofloxacin (CIPRO) 500 MG tablet Take 1 tablet (500 mg total) by mouth 2 (two) times daily. Patient not taking: Reported on 11/14/2015 08/13/15   Wallis BambergMario Mani, PA-C   BP 142/91 mmHg  Pulse 60  Temp(Src) 98.5 F (36.9 C) (Oral)  Resp 18  SpO2 100% Physical Exam  Constitutional: She is oriented to person, place, and time. She appears well-developed and well-nourished. No distress.  HENT:  Head: Normocephalic and atraumatic.  Right Ear: External ear normal.  Left Ear: External ear normal.  Nose: Nose normal.  Mouth/Throat: Oropharynx is clear and moist. No oropharyngeal exudate.  Eyes: Conjunctivae, EOM and lids are normal. Pupils are equal, round, and reactive to light. Lids are everted and swept, no foreign bodies found. Right eye exhibits no chemosis, no discharge and no exudate. Left eye exhibits no chemosis, no discharge and no exudate. Right conjunctiva is not injected. Right conjunctiva has no hemorrhage. Left conjunctiva is not injected. Left conjunctiva has no hemorrhage. No scleral icterus. Right eye exhibits normal extraocular motion and no nystagmus. Left eye exhibits normal extraocular motion and no nystagmus. Pupils are equal.  Mild ttp to left eyebrown and left maxilla No visible swelling, no ecchymosis Left lids normal EOM's normal Fluorescein eye exam:  no uptake, no foreign body  Neck: Normal range of motion. Neck supple. No JVD present. No tracheal deviation present.  Cardiovascular: Normal rate and regular rhythm.   Pulmonary/Chest: Effort normal and breath sounds normal. No stridor. No respiratory distress.  Abdominal: She exhibits no distension.  Musculoskeletal: Normal range of motion. She exhibits edema and  tenderness.       Left shoulder: Normal.       Left elbow: Normal.       Left upper arm: She exhibits swelling. She exhibits no tenderness, no bony tenderness, no edema, no deformity and no laceration.  Left lateral arm with 5 cm circular bruise with edema and ttp Normal left shoulder Normal left elbow  Lymphadenopathy:    She has no cervical adenopathy.  Neurological: She is alert and oriented to person, place, and time. She exhibits normal muscle tone. Coordination normal.  Skin: Skin is warm and dry. No rash noted. She is not diaphoretic. No erythema. No pallor.  Psychiatric: She has a normal mood and affect. Her behavior is normal. Judgment and thought content normal.  Nursing note and vitals reviewed.   ED Course  Procedures  DIAGNOSTIC STUDIES: Oxygen Saturation is 99% on RA, normal by my interpretation.    COORDINATION OF CARE: 10:32 PM Discussed treatment plan with pt at bedside which includes eye exam and pt agreed to plan.  Labs Review Labs Reviewed - No data to display  Imaging Review No results found. I have personally reviewed and evaluated these images and lab results as part of my medical decision-making.   EKG Interpretation None      MDM   Pt with arm and left facial contusion Eye exam normal with normal EOM's, negative fluorescein eye exam, no significant tenderness on exam of facial bones, no visible swelling or bruising, conjunctiva normal, pupils bilaterally normal, no indication for imaging, do not suspect any facial fractures.  Pt dx with facial contusion and left arm contusion, she was encouraged to apply ice and take NSAIDs as prescribed, tramadol given for breakthrough pain.  Pt advised to follow up with PCP or return to the ER as needed for worsening sx.  Return precautions reviewed, pt discharged home in good condition.  Final diagnoses:  Contusion of left arm, initial encounter  Facial contusion, initial encounter   I personally performed the  services described in this documentation, which was scribed in my presence. The recorded information has been reviewed and is accurate.      Molly Berry, PA-C 11/15/15 1610  Alvira Monday, MD 11/15/15 [id number]

## 2015-11-15 MED ORDER — TRAMADOL HCL 50 MG PO TABS: 50.0000 mg | ORAL_TABLET | Freq: Two times a day (BID) | ORAL | Status: DC | PRN

## 2015-11-15 MED ORDER — IBUPROFEN 800 MG PO TABS: 800.0000 mg | ORAL_TABLET | Freq: Three times a day (TID) | ORAL | Status: DC

## 2015-11-15 MED ORDER — IBUPROFEN 800 MG PO TABS
800.0000 mg | ORAL_TABLET | Freq: Once | ORAL | Status: AC
  Administered 2015-11-15: 800 mg via ORAL
  Filled 2015-11-15: qty 1

## 2015-11-15 NOTE — Discharge Instructions (Signed)
Contusion A contusion is a deep bruise. Contusions are the result of a blunt injury to tissues and muscle fibers under the skin. The injury causes bleeding under the skin. The skin overlying the contusion may turn blue, purple, or yellow. Minor injuries will give you a painless contusion, but more severe contusions may stay painful and swollen for a few weeks.  CAUSES  This condition is usually caused by a blow, trauma, or direct force to an area of the body. SYMPTOMS  Symptoms of this condition include:  Swelling of the injured area.  Pain and tenderness in the injured area.  Discoloration. The area may have redness and then turn blue, purple, or yellow. DIAGNOSIS  This condition is diagnosed based on a physical exam and medical history. An X-ray, CT scan, or MRI may be needed to determine if there are any associated injuries, such as broken bones (fractures). TREATMENT  Specific treatment for this condition depends on what area of the body was injured. In general, the best treatment for a contusion is resting, icing, applying pressure to (compression), and elevating the injured area. This is often called the RICE strategy. Over-the-counter anti-inflammatory medicines may also be recommended for pain control.  HOME CARE INSTRUCTIONS   Rest the injured area.  If directed, apply ice to the injured area:  Put ice in a plastic bag.  Place a towel between your skin and the bag.  Leave the ice on for 20 minutes, 2-3 times per day.  If directed, apply light compression to the injured area using an elastic bandage. Make sure the bandage is not wrapped too tightly. Remove and reapply the bandage as directed by your health care provider.  If possible, raise (elevate) the injured area above the level of your heart while you are sitting or lying down.  Take over-the-counter and prescription medicines only as told by your health care provider. SEEK MEDICAL CARE IF:  Your symptoms do not  improve after several days of treatment.  Your symptoms get worse.  You have difficulty moving the injured area. SEEK IMMEDIATE MEDICAL CARE IF:   You have severe pain.  You have numbness in a hand or foot.  Your hand or foot turns pale or cold.   This information is not intended to replace advice given to you by your health care provider. Make sure you discuss any questions you have with your health care provider.   Document Released: 01/20/2005 Document Revised: 01/01/2015 Document Reviewed: 08/28/2014 Elsevier Interactive Patient Education 2016 [place].  Facial or Scalp Contusion A facial or scalp contusion is a deep bruise on the face or head. Injuries to the face and head generally cause a lot of swelling, especially around the eyes. Contusions are the result of an injury that caused bleeding under the skin. The contusion may turn blue, purple, or yellow. Minor injuries will give you a painless contusion, but more severe contusions may stay painful and swollen for a few weeks.  CAUSES  A facial or scalp contusion is caused by a blunt injury or trauma to the face or head area.  SIGNS AND SYMPTOMS   Swelling of the injured area.   Discoloration of the injured area.   Tenderness, soreness, or pain in the injured area.  DIAGNOSIS  The diagnosis can be made by taking a medical history and doing a physical exam. An X-ray exam, CT scan, or MRI may be needed to determine if there are any associated injuries, such as broken bones (fractures).  TREATMENT  Often, the best treatment for a facial or scalp contusion is applying cold compresses to the injured area. Over-the-counter medicines may also be recommended for pain control.  HOME CARE INSTRUCTIONS   Only take over-the-counter or prescription medicines as directed by your health care provider.   Apply ice to the injured area.   Put ice in a plastic bag.   Place a towel between your skin and the bag.   Leave the  ice on for 20 minutes, 2-3 times a day.  SEEK MEDICAL CARE IF:  You have bite problems.   You have pain with chewing.   You are concerned about facial defects. SEEK IMMEDIATE MEDICAL CARE IF:  You have severe pain or a headache that is not relieved by medicine.   You have unusual sleepiness, confusion, or personality changes.   You throw up (vomit).   You have a persistent nosebleed.   You have double vision or blurred vision.   You have fluid drainage from your nose or ear.   You have difficulty walking or using your arms or legs.  MAKE SURE YOU:   Understand these instructions.  Will watch your condition.  Will get help right away if you are not doing well or get worse.   This information is not intended to replace advice given to you by your health care provider. Make sure you discuss any questions you have with your health care provider.   Document Released: 05/20/2004 Document Revised: 05/03/2014 Document Reviewed: 11/23/2012 Elsevier Interactive Patient Education 2016 [employer] Inc[employer]   Cryotherapy Cryotherapy means treatment with cold. Ice or gel packs can be used to reduce both pain and swelling. Ice is the most helpful within the first 24 to 48 hours after an injury or flare-up from overusing a muscle or joint. Sprains, strains, spasms, burning pain, shooting pain, and aches can all be eased with ice. Ice can also be used when recovering from surgery. Ice is effective, has very few side effects, and is safe for most people to use. PRECAUTIONS  Ice is not a safe treatment option for people with:  Raynaud phenomenon. This is a condition affecting small blood vessels in the extremities. Exposure to cold may cause your problems to return.  Cold hypersensitivity. There are many forms of cold hypersensitivity, including:  Cold urticaria. Red, itchy hives appear on the skin when the tissues begin to warm after being iced.  Cold erythema. This is a red, itchy  rash caused by exposure to cold.  Cold hemoglobinuria. Red blood cells break down when the tissues begin to warm after being iced. The hemoglobin that carry oxygen are passed into the urine because they cannot combine with blood proteins fast enough.  Numbness or altered sensitivity in the area being iced. If you have any of the following conditions, do not use ice until you have discussed cryotherapy with your caregiver:  Heart conditions, such as arrhythmia, angina, or chronic heart disease.  High blood pressure.  Healing wounds or open skin in the area being iced.  Current infections.  Rheumatoid arthritis.  Poor circulation.  Diabetes. Ice slows the blood flow in the region it is applied. This is beneficial when trying to stop inflamed tissues from spreading irritating chemicals to surrounding tissues. However, if you expose your skin to cold temperatures for too long or without the proper protection, you can damage your skin or nerves. Watch for signs of skin damage due to cold. HOME CARE INSTRUCTIONS Follow these tips to  use ice and cold packs safely.  Place a dry or damp towel between the ice and skin. A damp towel will cool the skin more quickly, so you may need to shorten the time that the ice is used.  For a more rapid response, add gentle compression to the ice.  Ice for no more than 10 to 20 minutes at a time. The bonier the area you are icing, the less time it will take to get the benefits of ice.  Check your skin after 5 minutes to make sure there are no signs of a poor response to cold or skin damage.  Rest 20 minutes or more between uses.  Once your skin is numb, you can end your treatment. You can test numbness by very lightly touching your skin. The touch should be so light that you do not see the skin dimple from the pressure of your fingertip. When using ice, most people will feel these normal sensations in this order: cold, burning, aching, and numbness.  Do  not use ice on someone who cannot communicate their responses to pain, such as small children or people with dementia. HOW TO MAKE AN ICE PACK Ice packs are the most common way to use ice therapy. Other methods include ice massage, ice baths, and cryosprays. Muscle creams that cause a cold, tingly feeling do not offer the same benefits that ice offers and should not be used as a substitute unless recommended by your caregiver. To make an ice pack, do one of the following:  Place crushed ice or a bag of frozen vegetables in a sealable plastic bag. Squeeze out the excess air. Place this bag inside another plastic bag. Slide the bag into a pillowcase or place a damp towel between your skin and the bag.  Mix 3 parts water with 1 part rubbing alcohol. Freeze the mixture in a sealable plastic bag. When you remove the mixture from the freezer, it will be slushy. Squeeze out the excess air. Place this bag inside another plastic bag. Slide the bag into a pillowcase or place a damp towel between your skin and the bag. SEEK MEDICAL CARE IF:  You develop white spots on your skin. This may give the skin a blotchy (mottled) appearance.  Your skin turns blue or pale.  Your skin becomes waxy or hard.  Your swelling gets worse. MAKE SURE YOU:   Understand these instructions.  Will watch your condition.  Will get help right away if you are not doing well or get worse.   This information is not intended to replace advice given to you by your health care provider. Make sure you discuss any questions you have with your health care provider.   Document Released: 12/07/2010 Document Revised: 05/03/2014 Document Reviewed: 12/07/2010 Elsevier Interactive Patient Education [employer]

## 2017-09-20 ENCOUNTER — Encounter: Payer: Self-pay | Admitting: Physician Assistant

## 2017-09-20 ENCOUNTER — Ambulatory Visit: Payer: 59 | Admitting: Physician Assistant

## 2017-09-20 VITALS — BP 144/74 | HR 92 | Temp 98.2°F | Resp 17 | Ht <= 58 in | Wt 125.0 lb

## 2017-09-20 DIAGNOSIS — R058 Other specified cough: Secondary | ICD-10-CM

## 2017-09-20 DIAGNOSIS — R05 Cough: Secondary | ICD-10-CM | POA: Diagnosis not present

## 2017-09-20 MED ORDER — HYDROCODONE-HOMATROPINE 5-1.5 MG/5ML PO SYRP: 5.0000 mL | ORAL_SOLUTION | Freq: Three times a day (TID) | ORAL | 0 refills | Status: DC | PRN

## 2017-09-20 MED ORDER — BENZONATATE 100 MG PO CAPS: 100.0000 mg | ORAL_CAPSULE | Freq: Three times a day (TID) | ORAL | 0 refills | Status: DC | PRN

## 2017-09-20 MED ORDER — LORATADINE-PSEUDOEPHEDRINE ER 5-120 MG PO TB12: 1.0000 | ORAL_TABLET | Freq: Two times a day (BID) | ORAL | 0 refills | Status: DC

## 2017-09-20 NOTE — Progress Notes (Signed)
   Molly Santos  MRN: [id number] DOB: 10/25/1970  PCP: Patient, No Pcp Per  Subjective:  Pt is a 47 year old female who presents to clinic for sore throat and cough x 4 days. She speaks Molly Santos - is here today with family member who is interpreting. Endorses HA. Symptoms started with cough. Cough is productive. Not sleeping well due to cough.  She has taken an otc medication to feel better, not helping.  Denies fever, chills, runny nose, shob, wheezing, sneezing, night sweats, reflux symptoms.    She does not smoke.   Past Medical History:  Diagnosis Date  . Hypertension     Review of Systems  Constitutional: Negative for chills, diaphoresis, fatigue and fever.  HENT: Positive for sore throat. Negative for congestion, postnasal drip, rhinorrhea, sinus pressure and sinus pain.   Respiratory: Positive for cough. Negative for shortness of breath and wheezing.   Psychiatric/Behavioral: Negative for sleep disturbance.    Patient Active Problem List   Diagnosis Date Noted  . Language barrier 04/10/2013    No current outpatient medications on file prior to visit.   No current facility-administered medications on file prior to visit.     Allergies  Allergen Reactions  . Oxycodone Other (See Comments)    Gave her a headache     Objective:  BP (!) 144/74   Pulse 92   Temp 98.2 F (36.8 C) (Oral)   Resp 17   Ht  (1.448 m)   Wt 125 lb (56.7 kg)   SpO2 98%   BMI 27.05 kg/m   Physical Exam  Constitutional: She is oriented to person, place, and time. No distress.  HENT:  Right Ear: Tympanic membrane normal.  Left Ear: Tympanic membrane normal.  Nose: Mucosal edema present. No rhinorrhea. Right sinus exhibits no maxillary sinus tenderness and no frontal sinus tenderness. Left sinus exhibits no maxillary sinus tenderness and no frontal sinus tenderness.  Mouth/Throat: Oropharynx is clear and moist and mucous membranes are normal.  Cardiovascular: Normal rate, regular  rhythm and normal heart sounds.  Pulmonary/Chest: Effort normal and breath sounds normal. No respiratory distress. She has no wheezes. She has no rales.  Neurological: She is alert and oriented to person, place, and time.  Skin: Skin is warm and dry.  Psychiatric: Judgment normal.  Vitals reviewed.   Assessment and Plan :  1. Upper airway cough syndrome - benzonatate (TESSALON) 100 MG capsule; Take 1-2 capsules (100-200 mg total) by mouth 3 (three) times daily as needed for cough.  Dispense: 40 capsule; Refill: 0 - loratadine-pseudoephedrine (CLARITIN-D 12 HOUR) 5-120 MG tablet; Take 1 tablet by mouth 2 (two) times daily.  Dispense: 20 tablet; Refill: 0 - HYDROcodone-homatropine (HYCODAN) 5-1.5 MG/5ML syrup; Take 5 mLs by mouth every 8 (eight) hours as needed for cough.  Dispense: 120 mL; Refill: 0 -Patient presents with cough x4 days.  Her vital signs are stable.  No complaints of fevers, chills, night sweats.  No adventitious lung sounds heard on physical exam.  Plan to treat for upper airway cough syndrome with supportive care.  Come back if she is not improving in 5 to 7 days, consider imaging. Marco Collie, PA-C  Primary Care at [facility] 09/20/2017 3:08 PM

## 2017-09-20 NOTE — Patient Instructions (Addendum)
Take loratadine-pseudoephedrine twice daily for the next 7 days.  Tessalon (benzonatate) will help reduce your cough.  Hycodan is a cough syrup that will help you sleep at night. This will make you drowsy. Use this as directed.   Come back in 7 days if you are not better.  Stay well hydrated - drink 2 liters of water/daily. Get lost of rest. Wash your hands often.   -Foods that can help speed recovery: honey, garlic, chicken soup, elderberries, green tea.  -Supplements that can help speed recovery: vitamin C, zinc, elderberry extract, quercetin, ginseng, selenium -Supplement with prebiotics and probiotics:   For sore throat: ? Gargle with 8 oz of salt water ( tsp of salt per 1 qt of water) as often as every 1-2 hours to soothe your throat.  Gargle liquid benadryl.  Cepacol throat lozenges.  For sore throat try using a honey-based tea. Use 3 teaspoons of honey with juice squeezed from half lemon. Place shaved pieces of ginger into 1/2-1 cup of water and warm over stove top. Then mix the ingredients and repeat every 4 hours as needed.  Cough Syrup Recipe: Sweet Lemon & Honey Thyme  Ingredients a handful of fresh thyme sprigs   1 pint of water (2 cups)  1/2 cup honey (raw is best, but regular will do)  1/2 lemon chopped Instructions 1. Place the lemon in the pint jar and cover with the honey. The honey will macerate the lemons and draw out liquids which taste so delicious! 2. Meanwhile, toss the thyme leaves into a saucepan and cover them with the water. 3. Bring the water to a gentle simmer and reduce it to half, about a cup of tea. 4. When the tea is reduced and cooled a bit, strain the sprigs & leaves, add it into the pint jar and stir it well. 5. Give it a shake and use a spoonful as needed. 6. Store your homemade cough syrup in the refrigerator for about a month.  Is there anything I can do on my own to get rid of my cough? Yes. To help get rid of your cough, you can: ?Use a  humidifier in your bedroom ?Use an over-the-counter cough medicine, or suck on cough drops or hard candy ?Stop smoking, if you smoke ?If you have allergies, avoid the things you are allergic to (like pollen, dust, animals, or mold) If you have acid reflux, your doctor or nurse will tell you which lifestyle changes can help reduce symptoms.      IF you received an x-ray today, you will receive an invoice from [facility]. Please contact [facility] at [phone] with questions or concerns regarding your invoice.   IF you received labwork today, you will receive an invoice from [place]. Please contact [facility] at [phone] with questions or concerns regarding your invoice.   Our billing staff will not be able to assist you with questions regarding bills from these companies.  You will be contacted with the lab results as soon as they are available. The fastest way to get your results is to activate your My Chart account. Instructions are located on the last page of this paperwork. If you have not heard from [place] regarding the results in 2 weeks, please contact this office.

## 2017-12-22 ENCOUNTER — Ambulatory Visit: Payer: BLUE CROSS/BLUE SHIELD | Admitting: Physician Assistant

## 2017-12-22 ENCOUNTER — Other Ambulatory Visit: Payer: Self-pay

## 2017-12-22 ENCOUNTER — Encounter: Payer: Self-pay | Admitting: Physician Assistant

## 2017-12-22 VITALS — BP 144/96 | Temp 98.3°F | Resp 16 | Ht <= 58 in | Wt 121.8 lb

## 2017-12-22 DIAGNOSIS — Z1329 Encounter for screening for other suspected endocrine disorder: Secondary | ICD-10-CM

## 2017-12-22 DIAGNOSIS — Z13228 Encounter for screening for other metabolic disorders: Secondary | ICD-10-CM | POA: Diagnosis not present

## 2017-12-22 DIAGNOSIS — I10 Essential (primary) hypertension: Secondary | ICD-10-CM | POA: Diagnosis not present

## 2017-12-22 DIAGNOSIS — Z130 Encounter for screening for diseases of the blood and blood-forming organs and certain disorders involving the immune mechanism: Secondary | ICD-10-CM | POA: Diagnosis not present

## 2017-12-22 LAB — POCT URINALYSIS DIP (MANUAL ENTRY)
Bilirubin, UA: NEGATIVE
Glucose, UA: NEGATIVE mg/dL
Ketones, POC UA: NEGATIVE mg/dL
Leukocytes, UA: NEGATIVE
Nitrite, UA: NEGATIVE
Protein Ur, POC: NEGATIVE mg/dL
Spec Grav, UA: 1.02 (ref 1.010–1.025)
Urobilinogen, UA: 0.2 U/dL
pH, UA: 7 (ref 5.0–8.0)

## 2017-12-22 MED ORDER — LISINOPRIL 5 MG PO TABS: 5.0000 mg | ORAL_TABLET | Freq: Every day | ORAL | 3 refills | Status: DC

## 2017-12-22 NOTE — Patient Instructions (Addendum)
Please read below for information about high blood pressures. You can lower your blood pressure with improved diet and routine exercise. The American Heart Association is a great resource to learn more.  Start taking Lisinopril 5mg  every morning. You will need to take this indefinitely.  Come back and see me for recheck of your blood pressure in 2-3 weeks.    Hypertension Hypertension is another name for high blood pressure. High blood pressure forces your heart to work harder to pump blood. This can cause problems over time. There are two numbers in a blood pressure reading. There is a top number (systolic) over a bottom number (diastolic). It is best to have a blood pressure below 120/80. Healthy choices can help lower your blood pressure. You may need medicine to help lower your blood pressure if:  Your blood pressure cannot be lowered with healthy choices.  Your blood pressure is higher than 130/80.  Follow these instructions at home: Eating and drinking  If directed, follow the DASH eating plan. This diet includes: ? Filling half of your plate at each meal with fruits and vegetables. ? Filling one quarter of your plate at each meal with whole grains. Whole grains include whole wheat pasta, brown rice, and whole grain bread. ? Eating or drinking low-fat dairy products, such as skim milk or low-fat yogurt. ? Filling one quarter of your plate at each meal with low-fat (lean) proteins. Low-fat proteins include fish, skinless chicken, eggs, beans, and tofu. ? Avoiding fatty meat, cured and processed meat, or chicken with skin. ? Avoiding premade or processed food.  Eat less than 1,500 mg of salt (sodium) a day.  Limit alcohol use to no more than 1 drink a day for nonpregnant women and 2 drinks a day for men. One drink equals 12 oz of beer, 5 oz of wine, or 1 oz of hard liquor. Lifestyle  Work with your doctor to stay at a healthy weight or to lose weight. Ask your doctor what the best  weight is for you.  Get at least 30 minutes of exercise that causes your heart to beat faster (aerobic exercise) most days of the week. This may include walking, swimming, or biking.  Get at least 30 minutes of exercise that strengthens your muscles (resistance exercise) at least 3 days a week. This may include lifting weights or pilates.  Do not use any products that contain nicotine or tobacco. This includes cigarettes and e-cigarettes. If you need help quitting, ask your doctor.  Check your blood pressure at home as told by your doctor.  Keep all follow-up visits as told by your doctor. This is important. Medicines  Take over-the-counter and prescription medicines only as told by your doctor. Follow directions carefully.  Do not skip doses of blood pressure medicine. The medicine does not work as well if you skip doses. Skipping doses also puts you at risk for problems.  Ask your doctor about side effects or reactions to medicines that you should watch for. Contact a doctor if:  You think you are having a reaction to the medicine you are taking.  You have headaches that keep coming back (recurring).  You feel dizzy.  You have swelling in your ankles.  You have trouble with your vision. Get help right away if:  You get a very bad headache.  You start to feel confused.  You feel weak or numb.  You feel faint.  You get very bad pain in your: ? Chest. ? Belly (  abdomen).  You throw up (vomit) more than once.  You have trouble breathing. Summary  Hypertension is another name for high blood pressure.  Making healthy choices can help lower blood pressure. If your blood pressure cannot be controlled with healthy choices, you may need to take medicine. This information is not intended to replace advice given to you by your health care provider. Make sure you discuss any questions you have with your health care provider. Document Released: 09/29/2007 Document Revised:  03/10/2016 Document Reviewed: 03/10/2016 Elsevier Interactive Patient Education  2018 [employer]  IF you received an x-ray today, you will receive an invoice from [facility]. Please contact [facility] at [phone] with questions or concerns regarding your invoice.   IF you received labwork today, you will receive an invoice from [facility]. Please contact [facility] at [phone] with questions or concerns regarding your invoice.   Our billing staff will not be able to assist you with questions regarding bills from these companies.  You will be contacted with the lab results as soon as they are available. The fastest way to get your results is to activate your My Chart account. Instructions are located on the last page of this paperwork. If you have not heard from us regarding the results in 2 weeks, please contact this office.

## 2017-12-22 NOTE — Progress Notes (Signed)
Molly Santos  MRN: [id number] DOB: [birth year]  PCP: Patient, No Pcp Per  Subjective:  Pt is a 47 year old female who presents to clinic for blood pressure check. She speaks Starlyn Skeans - is here today with family member who is interpreting.  She was told at [facility] 5 days ago where she was being seen for HA that she needs to have it checked - it was 160/?Marland Kitchen   She has been on blood pressure medication in the past for about three months. Stopped taking it "bc I ran out of refills".  Today's blood pressure is 144/96 Never Smoker No exercise Diet: rice, vegetables, meat. No fast food.  Denies lightheadedness, dizziness, chronic headache, double vision, chest pain, shortness of breath, heart racing, palpitations, nausea, vomiting, abdominal pain, hematuria, lower leg swelling.  Review of Systems  Cardiovascular: Negative for chest pain, palpitations and leg swelling.  Neurological: Negative for dizziness and headaches.    Patient Active Problem List   Diagnosis Date Noted  . Language barrier 04/10/2013    Current Outpatient Medications on File Prior to Visit  Medication Sig Dispense Refill  . loratadine-pseudoephedrine (CLARITIN-D 12 HOUR) 5-120 MG tablet Take 1 tablet by mouth 2 (two) times daily. (Patient not taking: Reported on 12/22/2017) 20 tablet 0   No current facility-administered medications on file prior to visit.     Allergies  Allergen Reactions  . Oxycodone Other (See Comments)    Gave her a headache     Objective:  BP (!) 144/96 (BP Location: Left Arm, Patient Position: Sitting, Cuff Size: Normal)   Temp 98.3 F (36.8 C) (Oral)   Resp 16   Ht '4\' 9"'$  (1.448 m)   Wt 121 lb 12.8 oz (55.2 kg)   SpO2 99%   BMI 26.36 kg/m  BP Readings from Last 3 Encounters:  12/22/17 (!) 144/96  09/20/17 (!) 144/74  11/14/15 142/91    Physical Exam  Constitutional: She is oriented to person, place, and time. No distress.  Cardiovascular: Normal rate, regular rhythm  and normal heart sounds.  Neurological: She is alert and oriented to person, place, and time.  Skin: Skin is warm and dry.  Psychiatric: Judgment normal.  Vitals reviewed.  Results for orders placed or performed in visit on 12/22/17  POCT urinalysis dipstick  Result Value Ref Range   Color, UA yellow yellow   Clarity, UA clear clear   Glucose, UA negative negative mg/dL   Bilirubin, UA negative negative   Ketones, POC UA negative negative mg/dL   Spec Grav, UA 1.020 1.010 - 1.025   Blood, UA trace-lysed (A) negative   pH, UA 7.0 5.0 - 8.0   Protein Ur, POC negative negative mg/dL   Urobilinogen, UA 0.2 0.2 or 1.0 E.U./dL   Nitrite, UA Negative Negative   Leukocytes, UA Negative Negative    Assessment and Plan :  1. Essential hypertension, benign - pt presents for elevated blood pressure reading. She has been treated for HTN in the past but was unaware it was a medication she was supposed to con't taking. HTN educations discussed and provided. RTC in 2-3 weeks for recheck.  - POCT urinalysis dipstick - lisinopril (PRINIVIL,ZESTRIL) 5 MG tablet; Take 1 tablet (5 mg total) by mouth daily.  Dispense: 30 tablet; Refill: 3  2. Screening for endocrine, metabolic and immunity disorder - Lipid panel - CMP14+EGFR - Hemoglobin A1c   Mercer Pod, PA-C  [facility] [place] 12/22/2017 3:49 PM  Please note: Portions of this report may have been transcribed using dragon voice recognition software. Every effort was made to ensure accuracy; however, inadvertent computerized transcription errors may be present.

## 2017-12-23 LAB — CMP14+EGFR
ALT: 13 IU/L (ref 0–32)
AST: 20 IU/L (ref 0–40)
Albumin/Globulin Ratio: 1.6 (ref 1.2–2.2)
Albumin: 4.6 g/dL (ref 3.5–5.5)
Alkaline Phosphatase: 44 IU/L (ref 39–117)
BUN/Creatinine Ratio: 14 (ref 9–23)
BUN: 11 mg/dL (ref 6–24)
Bilirubin Total: 0.3 mg/dL (ref 0.0–1.2)
CO2: 24 mmol/L (ref 20–29)
Calcium: 9.4 mg/dL (ref 8.7–10.2)
Chloride: 104 mmol/L (ref 96–106)
Creatinine, Ser: 0.81 mg/dL (ref 0.57–1.00)
GFR calc Af Amer: 99 mL/min/{1.73_m2} (ref >59)
GFR calc non Af Amer: 86 mL/min/{1.73_m2} (ref >59)
Globulin, Total: 2.8 g/dL (ref 1.5–4.5)
Glucose: 83 mg/dL (ref 65–99)
Potassium: 3.9 mmol/L (ref 3.5–5.2)
Sodium: 142 mmol/L (ref 134–144)
Total Protein: 7.4 g/dL (ref 6.0–8.5)

## 2017-12-23 LAB — LIPID PANEL
Chol/HDL Ratio: 3 ratio (ref 0.0–4.4)
Cholesterol, Total: 222 mg/dL — ABNORMAL HIGH (ref 100–199)
HDL: 75 mg/dL (ref >39)
LDL Calculated: 125 mg/dL — ABNORMAL HIGH (ref 0–99)
Triglycerides: 112 mg/dL (ref 0–149)
VLDL Cholesterol Cal: 22 mg/dL (ref 5–40)

## 2017-12-23 LAB — HEMOGLOBIN A1C
Est. average glucose Bld gHb Est-mCnc: 114 mg/dL
Hgb A1c MFr Bld: 5.6 % (ref 4.8–5.6)

## 2018-01-05 ENCOUNTER — Encounter: Payer: Self-pay | Admitting: Physician Assistant

## 2018-01-09 ENCOUNTER — Encounter: Payer: Self-pay | Admitting: Physician Assistant

## 2018-01-09 ENCOUNTER — Other Ambulatory Visit: Payer: Self-pay

## 2018-01-09 ENCOUNTER — Ambulatory Visit: Payer: BLUE CROSS/BLUE SHIELD | Admitting: Physician Assistant

## 2018-01-09 VITALS — BP 142/90 | HR 86 | Temp 98.3°F | Resp 18 | Ht <= 58 in | Wt 122.8 lb

## 2018-01-09 DIAGNOSIS — M545 Low back pain, unspecified: Secondary | ICD-10-CM

## 2018-01-09 DIAGNOSIS — R030 Elevated blood-pressure reading, without diagnosis of hypertension: Secondary | ICD-10-CM

## 2018-01-09 DIAGNOSIS — Z23 Encounter for immunization: Secondary | ICD-10-CM | POA: Diagnosis not present

## 2018-01-09 DIAGNOSIS — I10 Essential (primary) hypertension: Secondary | ICD-10-CM | POA: Diagnosis not present

## 2018-01-09 MED ORDER — LISINOPRIL-HYDROCHLOROTHIAZIDE 10-12.5 MG PO TABS: 1.0000 | ORAL_TABLET | Freq: Every day | ORAL | 3 refills | Status: DC

## 2018-01-09 MED ORDER — MELOXICAM 7.5 MG PO TABS: 7.5000 mg | ORAL_TABLET | Freq: Every day | ORAL | 0 refills | Status: DC

## 2018-01-09 MED ORDER — CYCLOBENZAPRINE HCL 10 MG PO TABS: 10.0000 mg | ORAL_TABLET | Freq: Three times a day (TID) | ORAL | 0 refills | Status: DC | PRN

## 2018-01-09 NOTE — Patient Instructions (Addendum)
Come back and see me in 3-4 weeks for blood pressure recheck  Start taking Prinzide 10-12.5mg  every day for your blood pressure.  --------------------------------------------------------------------------------------  Meloxiam is an NSAID. Do not use with any other otc pain medication other than tylenol/acetaminophen - so no aleve, ibuprofen, motrin, advil, etc. You can take 1-2 pills daily.   Flexeril is a muscle relaxer. This may make you drowsy. Do not take this before work. (you can take these medications together)  Apply moist heat to the area. Wet a towel and wring it out so it is damp. Put it in the microwave for about 15-20 seconds - long enough to make it hot, but not too hot to apply to your skin causing burns. Do this for about 20-30 minutes, 3-4 times a day.   Perform gentle, light stretches 2-3 times a day.   Put a tennis ball between your back and a wall. Gentle massage the area with rolling the ball around the affected area. Try a foam roller.   Stay well hydrated - try to drink 32-64 oz/day.   If you feel like you need a new pillow, try "My Pillow".  Come back and see me if you would like to try a dry needling session. (see below).    Trigger Point Dry Needling   What is Trigger Point Dry Needling (DN)?   1. DN is a physical therapy technique used to treat muscle pain and Dysfunction.  Specifically, DN helps deactivate muscle trigger points (Muscle Knots).   2. A thin filiform needle is used to penetrate the skin and stimulate the underlying trigger point.  The goal is for a local twitch response (LTR) to occur and for the trigger point to relax.  No medication of any kind is injected during the procedure.   What Does Trigger Point Dry Needling Feel Like?   1. The procedures feels different for each individual patient.   Some patients report that they do not actually feel the needle enter the skin and overall the process is not  painful.  Very  mild bleeding may occur.   However, many patients feel a deep cramping in the muscle in which the needle was inserted. This is the local twitch response.    How Will I Feel After The Treatment?   1. Soreness is normal, and the onset of soreness may not occur for a few hours.  Typically this soreness does not last longer than two days.   2. Bruising is uncommon, however; ice can be used to decrease any possible bruising.   3. In rare cases feeling tired or nauseous after the treatment is normal.  In addition, your symptoms may get worse before they get better, this period will typically not last longer than 24 hours.   What Can I do After My Treatment?   1.  Increase your hydration by drinking more water for the next 24 hours.   2.  You may place ice or heat on the areas treated that have become sore, however don not use heat on inflamed or bruised areas.  Heat often brings more relief post needling.   3. You can continue your regular activities, but vigorous activity is not recommended initially after the treatment for 24 hours.   4. DN is best combined with other physical therapy such as strengthening, stretching, and other therapies.    IF you received an x-ray today, you will receive an invoice from [facility]. Please contact [facility] at [phone] with  questions or concerns regarding your invoice.   IF you received labwork today, you will receive an invoice from [facility]. Please contact [facility] at [phone] with questions or concerns regarding your invoice.   Our billing staff will not be able to assist you with questions regarding bills from these companies.  You will be contacted with the lab results as soon as they are available. The fastest way to get your results is to activate your My Chart account. Instructions are located on the last page of this paperwork. If you have not heard from us regarding the results in 2 weeks, please contact this office.       IF you received an  x-ray today, you will receive an invoice from [facility]. Please contact [facility] at [phone] with questions or concerns regarding your invoice.   IF you received labwork today, you will receive an invoice from [facility]. Please contact [facility] at [phone] with questions or concerns regarding your invoice.   Our billing staff will not be able to assist you with questions regarding bills from these companies.  You will be contacted with the lab results as soon as they are available. The fastest way to get your results is to activate your My Chart account. Instructions are located on the last page of this paperwork. If you have not heard from us regarding the results in 2 weeks, please contact this office.

## 2018-01-09 NOTE — Progress Notes (Signed)
Molly Santos  MRN: [id number] DOB: 05/15/1970  PCP: Patient, No Pcp Per  Subjective:  Pt is a 47 year old female who presents to clinic for f/u HTN.  She speaks Molly Santos - is here today with family member who is interpreting.   HTN - First OV for this problem was 8/29. started prinzide 5mg  qd.  Today's blood pressure is 136/93, recheck is 142/90. She doe not check home blood pressures.  She takes medication every morning. Denies SE.  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   Never Smoker No exercise Diet: rice, vegetables, meat. No fast food.  Denies lightheadedness, dizziness, chronic headache, double vision, chest pain, shortness of breath, heart racing, palpitations, nausea, vomiting, abdominal pain, hematuria, lower leg swelling.  BP Readings from Last 3 Encounters:  01/09/18 (!) 136/93  12/22/17 (!) 144/96  09/20/17 (!) 144/74   Low back x 8 days -right low back pain. She was bending down to pick something up. Pain does not radiate. Pain with standing from seating.  She has taken ibuprofen, helped a little bit.  Massage helps a little bit.  She is wearing a back brace, helps a little bit. Denies reduced ROM, muscle weakness, n/t, saddle paresthesia.   Review of Systems  Constitutional: Negative for diaphoresis and fatigue.  Respiratory: Negative for cough, shortness of breath and wheezing.   Cardiovascular: Negative for chest pain and palpitations.    Patient Active Problem List   Diagnosis Date Noted  . Essential hypertension, benign 12/22/2017  . Language barrier 04/10/2013    Current Outpatient Medications on File Prior to Visit  Medication Sig Dispense Refill  . lisinopril (PRINIVIL,ZESTRIL) 5 MG tablet Take 1 tablet (5 mg total) by mouth daily. 30 tablet 3  . loratadine-pseudoephedrine (CLARITIN-D 12 HOUR) 5-120 MG tablet Take 1 tablet by mouth 2 (two) times daily. (Patient not  taking: Reported on 12/22/2017) 20 tablet 0   No current facility-administered medications on file prior to visit.     Allergies  Allergen Reactions  . Oxycodone Other (See Comments)    Gave her a headache     Objective:  BP (!) 136/93   Pulse 86   Temp 98.3 F (36.8 C) (Oral)   Resp 18   Ht 4\' 9"  (1.448 m)   Wt 122 lb 12.8 oz (55.7 kg)   SpO2 98%   BMI 26.57 kg/m   Physical Exam  Constitutional: She is oriented to person, place, and time. No distress.  Cardiovascular: Normal rate, regular rhythm and normal heart sounds.  Musculoskeletal:       Back:  Neurological: She is alert and oriented to person, place, and time.  Skin: Skin is warm and dry.  Psychiatric: Judgment normal.  Vitals reviewed.   Assessment and Plan :  1. Essential hypertension 2. Elevated blood pressure reading - Pt presents for f/u HTN. First OV for this problem was 8/29. started prinzide 5mg  qd.  Today's blood pressure is 136/93, recheck is 142/90. She does not check home blood pressures.  She takes medication every morning. Denies SE. Will increase dose and add HCTZ. RTC in 3 weeks for recheck.  - Recheck vitals - lisinopril-hydrochlorothiazide (PRINZIDE,ZESTORETIC) 10-12.5 MG tablet; Take 1 tablet by mouth daily.  Dispense: 90 tablet; Refill: 3  3. Right-sided low back pain without sciatica, unspecified chronicity - Pt c/o acute back pain. No red flags. Advised heat, massage, hydration. Will  try flexeril and mobic. RTC in 3-4 weeks if no improvement. Consider PT referral.  - cyclobenzaprine (FLEXERIL) 10 MG tablet; Take 1 tablet (10 mg total) by mouth 3 (three) times daily as needed for muscle spasms.  Dispense: 30 tablet; Refill: 0 - meloxicam (MOBIC) 7.5 MG tablet; Take 1-2 tablets (7.5-15 mg total) by mouth daily.  Dispense: 60 tablet; Refill: 0  4. Flu vaccine need - Administered by CMA.  - Flu Vaccine QUAD 36+ mos IM   Molly Collie, PA-C  [facility] at [facility] 01/09/2018 3:53 PM  Please note: Portions of this report may have been transcribed using dragon voice recognition software. Every effort was made to ensure accuracy; however, inadvertent computerized transcription errors may be present.

## 2018-02-01 ENCOUNTER — Other Ambulatory Visit: Payer: Self-pay

## 2018-02-01 ENCOUNTER — Ambulatory Visit: Payer: BLUE CROSS/BLUE SHIELD | Admitting: Physician Assistant

## 2018-02-01 ENCOUNTER — Encounter: Payer: Self-pay | Admitting: Physician Assistant

## 2018-02-01 VITALS — BP 138/88 | HR 82 | Temp 98.5°F | Resp 18 | Ht <= 58 in | Wt 123.4 lb

## 2018-02-01 DIAGNOSIS — I10 Essential (primary) hypertension: Secondary | ICD-10-CM | POA: Diagnosis not present

## 2018-02-01 NOTE — Progress Notes (Signed)
   Molly Santos  MRN: [id number] DOB: 1970-11-19  PCP: Sebastian Ache, PA-C  Subjective:  Pt is a 47 year old female who presents to clinic for HTN check. She is here today with daughter who is interpreting.  Last OV for this problem 9/16. Started prinzide 10-12.5 mg. She takes medication in the evening bc it makes her sleepy blood pressure today is 138/88 BP Readings from Last 3 Encounters:  02/01/18 138/88  01/09/18 (!) 142/90  12/22/17 (!) 144/96   She does not check home blood pressures.  Denies lightheadedness, dizziness, chronic headache, double vision, chest pain, shortness of breath, heart racing, palpitations, nausea, vomiting, abdominal pain, hematuria, lower leg swelling.  Review of Systems  Constitutional: Negative for diaphoresis and fatigue.  Respiratory: Negative for cough and chest tightness.   Cardiovascular: Negative for chest pain, palpitations and leg swelling.  Gastrointestinal: Negative for abdominal pain, nausea and vomiting.  Neurological: Negative for dizziness, light-headedness and headaches.    Patient Active Problem List   Diagnosis Date Noted  . Essential hypertension, benign 12/22/2017  . Language barrier 04/10/2013    Current Outpatient Medications on File Prior to Visit  Medication Sig Dispense Refill  . cyclobenzaprine (FLEXERIL) 10 MG tablet Take 1 tablet (10 mg total) by mouth 3 (three) times daily as needed for muscle spasms. 30 tablet 0  . lisinopril (PRINIVIL,ZESTRIL) 5 MG tablet Take 1 tablet (5 mg total) by mouth daily. 30 tablet 3  . lisinopril-hydrochlorothiazide (PRINZIDE,ZESTORETIC) 10-12.5 MG tablet Take 1 tablet by mouth daily. 90 tablet 3  . meloxicam (MOBIC) 7.5 MG tablet Take 1-2 tablets (7.5-15 mg total) by mouth daily. 60 tablet 0  . loratadine-pseudoephedrine (CLARITIN-D 12 HOUR) 5-120 MG tablet Take 1 tablet by mouth 2 (two) times daily. (Patient not taking: Reported on 02/01/2018) 20 tablet 0   No current  facility-administered medications on file prior to visit.     Allergies  Allergen Reactions  . Oxycodone Other (See Comments)    Gave her a headache     Objective:  BP 138/88   Pulse 82   Temp 98.5 F (36.9 C) (Oral)   Resp 18   Ht 4\' 9"  (1.448 m)   Wt 123 lb 6.4 oz (56 kg)   SpO2 96%   BMI 26.70 kg/m   Physical Exam  Constitutional: She is oriented to person, place, and time. No distress.  Cardiovascular: Normal rate, regular rhythm and normal heart sounds.  Neurological: She is alert and oriented to person, place, and time.  Skin: Skin is warm and dry.  Psychiatric: Judgment normal.  Vitals reviewed.   Assessment and Plan :  1. Essential hypertension, benign - Pt presents f/u HTN. Controlled. Today's blood pressure is 138/88. Advised DASH diet. Cont prinzide 10-12.5 mg. RTC in 6 months for annual exam.   Marco Collie, PA-C  [facility] at [facility] 02/01/2018 8:41 AM  Please note: Portions of this report may have been transcribed using dragon voice recognition software. Every effort was made to ensure accuracy; however, inadvertent computerized transcription errors may be present.

## 2018-02-01 NOTE — Patient Instructions (Addendum)
Come back in 4-5 months for annual exam.    DASH Eating Plan DASH stands for "Dietary Approaches to Stop Hypertension." The DASH eating plan is a healthy eating plan that has been shown to reduce high blood pressure (hypertension). It may also reduce your risk for type 2 diabetes, heart disease, and stroke. The DASH eating plan may also help with weight loss. What are tips for following this plan? General guidelines  Avoid eating more than 2,300 mg (milligrams) of salt (sodium) a day. If you have hypertension, you may need to reduce your sodium intake to 1,500 mg a day.  Limit alcohol intake to no more than 1 drink a day for nonpregnant women and 2 drinks a day for men. One drink equals 12 oz of beer, 5 oz of wine, or 1 oz of hard liquor.  Work with your health care provider to maintain a healthy body weight or to lose weight. Ask what an ideal weight is for you.  Get at least 30 minutes of exercise that causes your heart to beat faster (aerobic exercise) most days of the week. Activities may include walking, swimming, or biking.  Work with your health care provider or diet and nutrition specialist (dietitian) to adjust your eating plan to your individual calorie needs. Reading food labels  Check food labels for the amount of sodium per serving. Choose foods with less than 5 percent of the Daily Value of sodium. Generally, foods with less than 300 mg of sodium per serving fit into this eating plan.  To find whole grains, look for the word "whole" as the first word in the ingredient list. Shopping  Buy products labeled as "low-sodium" or "no salt added."  Buy fresh foods. Avoid canned foods and premade or frozen meals. Cooking  Avoid adding salt when cooking. Use salt-free seasonings or herbs instead of table salt or sea salt. Check with your health care provider or pharmacist before using salt substitutes.  Do not fry foods. Cook foods using healthy methods such as baking, boiling,  grilling, and broiling instead.  Cook with heart-healthy oils, such as olive, canola, soybean, or sunflower oil. Meal planning   Eat a balanced diet that includes: ? 5 or more servings of fruits and vegetables each day. At each meal, try to fill half of your plate with fruits and vegetables. ? Up to 6-8 servings of whole grains each day. ? Less than 6 oz of lean meat, poultry, or fish each day. A 3-oz serving of meat is about the same size as a deck of cards. One egg equals 1 oz. ? 2 servings of low-fat dairy each day. ? A serving of nuts, seeds, or beans 5 times each week. ? Heart-healthy fats. Healthy fats called Omega-3 fatty acids are found in foods such as flaxseeds and coldwater fish, like sardines, salmon, and mackerel.  Limit how much you eat of the following: ? Canned or prepackaged foods. ? Food that is high in trans fat, such as fried foods. ? Food that is high in saturated fat, such as fatty meat. ? Sweets, desserts, sugary drinks, and other foods with added sugar. ? Full-fat dairy products.  Do not salt foods before eating.  Try to eat at least 2 vegetarian meals each week.  Eat more home-cooked food and less restaurant, buffet, and fast food.  When eating at a restaurant, ask that your food be prepared with less salt or no salt, if possible. What foods are recommended? The items listed may  not be a complete list. Talk with your dietitian about what dietary choices are best for you. Grains Whole-grain or whole-wheat bread. Whole-grain or whole-wheat pasta. Brown rice. [place]. Bulgur. Whole-grain and low-sodium cereals. Pita bread. Low-fat, low-sodium crackers. Whole-wheat flour tortillas. Vegetables Fresh or frozen vegetables (raw, steamed, roasted, or grilled). Low-sodium or reduced-sodium tomato and vegetable juice. Low-sodium or reduced-sodium tomato sauce and tomato paste. Low-sodium or reduced-sodium canned vegetables. Fruits All fresh, dried, or frozen  fruit. Canned fruit in natural juice (without added sugar). Meat and other protein foods Skinless chicken or [place]. Ground chicken or [place]. Pork with fat trimmed off. Fish and seafood. Egg whites. Dried beans, peas, or lentils. Unsalted nuts, nut butters, and seeds. Unsalted canned beans. Lean cuts of beef with fat trimmed off. Low-sodium, lean deli meat. Dairy Low-fat (1%) or fat-free (skim) milk. Fat-free, low-fat, or reduced-fat cheeses. Nonfat, low-sodium ricotta or cottage cheese. Low-fat or nonfat yogurt. Low-fat, low-sodium cheese. Fats and oils Soft margarine without trans fats. Vegetable oil. Low-fat, reduced-fat, or light mayonnaise and salad dressings (reduced-sodium). Canola, safflower, olive, soybean, and sunflower oils. Avocado. Seasoning and other foods Herbs. Spices. Seasoning mixes without salt. Unsalted popcorn and pretzels. Fat-free sweets. What foods are not recommended? The items listed may not be a complete list. Talk with your dietitian about what dietary choices are best for you. Grains Baked goods made with fat, such as croissants, muffins, or some breads. Dry pasta or rice meal packs. Vegetables Creamed or fried vegetables. Vegetables in a cheese sauce. Regular canned vegetables (not low-sodium or reduced-sodium). Regular canned tomato sauce and paste (not low-sodium or reduced-sodium). Regular tomato and vegetable juice (not low-sodium or reduced-sodium). Angie Fava. Olives. Fruits Canned fruit in a light or heavy syrup. Fried fruit. Fruit in cream or butter sauce. Meat and other protein foods Fatty cuts of meat. Ribs. Fried meat. Berniece Salines. Sausage. Bologna and other processed lunch meats. Salami. Fatback. Hotdogs. Bratwurst. Salted nuts and seeds. Canned beans with added salt. Canned or smoked fish. Whole eggs or egg yolks. Chicken or [place] with skin. Dairy Whole or 2% milk, cream, and half-and-half. Whole or full-fat cream cheese. Whole-fat or sweetened yogurt. Full-fat  cheese. Nondairy creamers. Whipped toppings. Processed cheese and cheese spreads. Fats and oils Butter. Stick margarine. Lard. Shortening. Ghee. Bacon fat. Tropical oils, such as coconut, palm kernel, or palm oil. Seasoning and other foods Salted popcorn and pretzels. Onion salt, garlic salt, seasoned salt, table salt, and sea salt. Worcestershire sauce. Tartar sauce. Barbecue sauce. Teriyaki sauce. Soy sauce, including reduced-sodium. Steak sauce. Canned and packaged gravies. Fish sauce. Oyster sauce. Cocktail sauce. Horseradish that you find on the shelf. Ketchup. Mustard. Meat flavorings and tenderizers. Bouillon cubes. Hot sauce and Tabasco sauce. Premade or packaged marinades. Premade or packaged taco seasonings. Relishes. Regular salad dressings. Where to find more information:  National Heart, Lung, and [place]: https://wilson-eaton.com/  American Heart Association: www.heart.org Summary  The DASH eating plan is a healthy eating plan that has been shown to reduce high blood pressure (hypertension). It may also reduce your risk for type 2 diabetes, heart disease, and stroke.  With the DASH eating plan, you should limit salt (sodium) intake to 2,300 mg a day. If you have hypertension, you may need to reduce your sodium intake to 1,500 mg a day.  When on the DASH eating plan, aim to eat more fresh fruits and vegetables, whole grains, lean proteins, low-fat dairy, and heart-healthy fats.  Work with your health care provider or diet and  nutrition specialist (dietitian) to adjust your eating plan to your individual calorie needs. This information is not intended to replace advice given to you by your health care provider. Make sure you discuss any questions you have with your health care provider. Document Released: 04/01/2011 Document Revised: 04/05/2016 Document Reviewed: 04/05/2016 Elsevier Interactive Patient Education  2018 [employer].   IF you received an x-ray today, you will  receive an invoice from [facility]. Please contact [facility] at [phone] with questions or concerns regarding your invoice.   IF you received labwork today, you will receive an invoice from [facility]. Please contact [facility] at [phone] with questions or concerns regarding your invoice.   Our billing staff will not be able to assist you with questions regarding bills from these companies.  You will be contacted with the lab results as soon as they are available. The fastest way to get your results is to activate your My Chart account. Instructions are located on the last page of this paperwork. If you have not heard from [place] regarding the results in 2 weeks, please contact this office.

## 2018-02-23 ENCOUNTER — Other Ambulatory Visit: Payer: Self-pay | Admitting: Physician Assistant

## 2018-02-23 DIAGNOSIS — M545 Low back pain, unspecified: Secondary | ICD-10-CM

## 2018-02-24 NOTE — Telephone Encounter (Signed)
Requested Prescriptions  Pending Prescriptions Disp Refills  . meloxicam (MOBIC) 7.5 MG tablet [Pharmacy Med Name: MELOXICAM 7.5MG  TABLETS] 60 tablet 0    Sig: TAKE 1 TO 2 TABLETS BY MOUTH DAILY     Analgesics:  COX2 Inhibitors Failed - 02/23/2018  7:23 PM      Failed - HGB in normal range and within 360 days    Hemoglobin  Date Value Ref Range Status  11/14/2014 12.8 12.2 - 16.2 g/dL Final  16/01/9603 54.0  Final         Passed - Cr in normal range and within 360 days    Creatinine, Ser  Date Value Ref Range Status  12/22/2017 0.81 0.57 - 1.00 mg/dL Final         Passed - Patient is not pregnant      Passed - Valid encounter within last 12 months    Recent Outpatient Visits          3 weeks ago Essential hypertension, benign   [facility] at [facility], Madelaine Bhat, PA-C   1 month ago Essential hypertension   Primary Care at [facility], [place], PA-C   2 months ago Essential hypertension, benign   Primary Care at [facility], Madelaine Bhat, PA-C   5 months ago Upper airway cough syndrome   Primary Care at [facility], Madelaine Bhat, PA-C   2 years ago Cystitis   Primary Care at [facility], [place], [place]

## 2020-01-26 ENCOUNTER — Encounter (HOSPITAL_COMMUNITY): Payer: Self-pay | Admitting: Emergency Medicine

## 2020-01-26 ENCOUNTER — Emergency Department (HOSPITAL_COMMUNITY): Payer: 59

## 2020-01-26 ENCOUNTER — Emergency Department (HOSPITAL_COMMUNITY)
Admission: EM | Admit: 2020-01-26 | Discharge: 2020-01-27 | Disposition: A | Discharge status: Home / Self Care | Payer: 59 | Attending: Emergency Medicine | Admitting: Emergency Medicine

## 2020-01-26 ENCOUNTER — Other Ambulatory Visit: Payer: Self-pay

## 2020-01-26 DIAGNOSIS — Z79899 Other long term (current) drug therapy: Secondary | ICD-10-CM | POA: Insufficient documentation

## 2020-01-26 DIAGNOSIS — I10 Essential (primary) hypertension: Secondary | ICD-10-CM | POA: Diagnosis not present

## 2020-01-26 DIAGNOSIS — S199XXA Unspecified injury of neck, initial encounter: Secondary | ICD-10-CM | POA: Diagnosis present

## 2020-01-26 DIAGNOSIS — S0990XA Unspecified injury of head, initial encounter: Secondary | ICD-10-CM | POA: Diagnosis not present

## 2020-01-26 DIAGNOSIS — S3992XA Unspecified injury of lower back, initial encounter: Secondary | ICD-10-CM | POA: Insufficient documentation

## 2020-01-26 DIAGNOSIS — S161XXA Strain of muscle, fascia and tendon at neck level, initial encounter: Secondary | ICD-10-CM

## 2020-01-26 DIAGNOSIS — S39012A Strain of muscle, fascia and tendon of lower back, initial encounter: Secondary | ICD-10-CM
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
## 2020-01-27 NOTE — Discharge Instructions (Addendum)
Take ibuprofen 600 mg every 6 hours as needed for pain.  Rest.  Follow-up with your primary doctor if symptoms are not improving in the next week. 

## 2020-06-09 ENCOUNTER — Other Ambulatory Visit: Payer: Self-pay

## 2020-06-09 ENCOUNTER — Ambulatory Visit (INDEPENDENT_AMBULATORY_CARE_PROVIDER_SITE_OTHER): Payer: 59 | Admitting: Family Medicine

## 2020-06-09 ENCOUNTER — Encounter: Payer: Self-pay | Admitting: Family Medicine

## 2020-06-09 VITALS — BP 185/101 | HR 73 | Temp 98.7°F | Ht <= 58 in | Wt 120.4 lb

## 2020-06-09 DIAGNOSIS — Z13220 Encounter for screening for lipoid disorders: Secondary | ICD-10-CM | POA: Diagnosis not present

## 2020-06-09 DIAGNOSIS — M25521 Pain in right elbow: Secondary | ICD-10-CM

## 2020-06-09 DIAGNOSIS — Z130 Encounter for screening for diseases of the blood and blood-forming organs and certain disorders involving the immune mechanism: Secondary | ICD-10-CM

## 2020-06-09 DIAGNOSIS — Z1211 Encounter for screening for malignant neoplasm of colon: Secondary | ICD-10-CM

## 2020-06-09 DIAGNOSIS — I10 Essential (primary) hypertension: Secondary | ICD-10-CM

## 2020-06-09 DIAGNOSIS — M25512 Pain in left shoulder: Secondary | ICD-10-CM | POA: Diagnosis not present

## 2020-06-09 DIAGNOSIS — Z1329 Encounter for screening for other suspected endocrine disorder: Secondary | ICD-10-CM

## 2020-06-09 DIAGNOSIS — G8929 Other chronic pain: Secondary | ICD-10-CM

## 2020-06-09 DIAGNOSIS — Z1231 Encounter for screening mammogram for malignant neoplasm of breast: Secondary | ICD-10-CM

## 2020-06-09 DIAGNOSIS — Z13228 Encounter for screening for other metabolic disorders: Secondary | ICD-10-CM

## 2020-06-09 LAB — LIPID PANEL

## 2020-06-09 LAB — CBC

## 2020-06-09 LAB — CMP14+EGFR

## 2020-06-09 MED ORDER — HYDROCHLOROTHIAZIDE 25 MG PO TABS: 25.0000 mg | ORAL_TABLET | Freq: Every day | ORAL | 3 refills | Status: DC

## 2020-06-09 MED ORDER — DICLOFENAC SODIUM 1 % EX GEL: 4.0000 g | Freq: Four times a day (QID) | CUTANEOUS | 3 refills | Status: AC

## 2020-06-09 MED ORDER — VALSARTAN 80 MG PO TABS: 80.0000 mg | ORAL_TABLET | Freq: Every day | ORAL | 3 refills | Status: DC

## 2020-06-09 MED ORDER — DICLOFENAC SODIUM 75 MG PO TBEC: 75.0000 mg | DELAYED_RELEASE_TABLET | Freq: Two times a day (BID) | ORAL | 0 refills | Status: AC

## 2020-06-09 NOTE — Patient Instructions (Addendum)
Referral placed for GI to schedule colonoscopy for colon cancer screening, they will call you  Order placed for mammogram, they will call you to scheduled  I will follow up with lab results  Pain: Voltaren gel as needed, take Diclofenac twice a day for pain ( do not take with aleve or ibuprofen) referral was placed for physical therapy  Next visit in 2 weeks will do pap smear and follow up with pain and BP  For BP: start daily HCTZ, valsartan 80mg  daily   Shoulder Pain Many things can cause shoulder pain, including:  An injury.  Moving the shoulder in the same way again and again (overuse).  Joint pain (arthritis). Pain can come from:  Swelling and irritation (inflammation) of any part of the shoulder.  An injury to the shoulder joint.  An injury to: ? Tissues that connect muscle to bone (tendons). ? Tissues that connect bones to each other (ligaments). ? Bones. Follow these instructions at home: Watch for changes in your symptoms. Let your doctor know about them. Follow these instructions to help with your pain. If you have a sling:  Wear the sling as told by your doctor. Remove it only as told by your doctor.  Loosen the sling if your fingers: ? Tingle. ? Become numb. ? Turn cold and blue.  Keep the sling clean.  If the sling is not waterproof: ? Do not let it get wet. ? Take the sling off when you shower or bathe. Managing pain, stiffness, and swelling  If told, put ice on the painful area: ? Put ice in a plastic bag. ? Place a towel between your skin and the bag. ? Leave the ice on for 20 minutes, 2-3 times a day. Stop putting ice on if it does not help with the pain.  Squeeze a soft ball or a foam pad as much as possible. This prevents swelling in the shoulder. It also helps to strengthen the arm.   General instructions  Take over-the-counter and prescription medicines only as told by your doctor.  Keep all follow-up visits as told by your doctor.  This is important. Contact a doctor if:  Your pain gets worse.  Medicine does not help your pain.  You have new pain in your arm, hand, or fingers. Get help right away if:  Your arm, hand, or fingers: ? Tingle. ? Are numb. ? Are swollen. ? Are painful. ? Turn white or blue. Summary  Shoulder pain can be caused by many things. These include injury, moving the shoulder in the same away again and again, and joint pain.  Watch for changes in your symptoms. Let your doctor know about them.  This condition may be treated with a sling, ice, and pain medicine.  Contact your doctor if the pain gets worse or you have new pain. Get help right away if your arm, hand, or fingers tingle or get numb, swollen, or painful.  Keep all follow-up visits as told by your doctor. This is important. This information is not intended to replace advice given to you by your health care provider. Make sure you discuss any questions you have with your health care provider. Document Revised: 10/25/2017 Document Reviewed: 10/25/2017 Elsevier Patient Education  2021 2022.  If you have lab work done today you will be contacted with your lab results within the next 2 weeks.  If you have not heard from [employer] then please contact us. The fastest way to get your results is to  register for My Chart.   IF you received an x-ray today, you will receive an invoice from [facility]. Please contact [facility] at [phone] with questions or concerns regarding your invoice.   IF you received labwork today, you will receive an invoice from [place]. Please contact [facility] at [phone] with questions or concerns regarding your invoice.   Our billing staff will not be able to assist you with questions regarding bills from these companies.  You will be contacted with the lab results as soon as they are available. The fastest way to get your results is to activate your My Chart account.  Instructions are located on the last page of this paperwork. If you have not heard from [place] regarding the results in 2 weeks, please contact this office.

## 2020-06-09 NOTE — Progress Notes (Signed)
2/14/20221:43 PM  Molly Santos [birth year], 50 y.o., female [id number]  Chief Complaint  Patient presents with  . left arm and right shoulder pain     Both been going on several weeks     HPI:   Patient is a 50 y.o. female with past medical history significant for HTN who presents today for Pain.  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 0 No No No  Number falls in past yr: 0 - - -  Injury with Fall? 0 - - -  Follow up Falls evaluation completed - - -     Allergies  Allergen Reactions  . Oxycodone Other (See Comments)    Gave her a headache    Prior to Admission medications   Medication Sig Start Date End Date Taking? Authorizing Provider  lisinopril-hydrochlorothiazide (PRINZIDE,ZESTORETIC) 10-12.5 MG tablet Take 1 tablet by mouth daily. Patient not taking: Reported on 06/09/2020 01/09/18   McVey, EGelene Mink PA-C    Past Medical History:  Diagnosis Date  . Hypertension     No past surgical history on  file.  Social History   Tobacco Use  . Smoking status: Never Smoker  . Smokeless tobacco: Never Used  Substance Use Topics  . Alcohol use: No    No family history on file.  Review of Systems  Constitutional: Negative for chills, fever and malaise/fatigue.  Eyes: Negative for blurred vision and double vision.  Respiratory: Negative for cough, shortness of breath and wheezing.   Cardiovascular: Negative for chest pain, palpitations and leg swelling.  Gastrointestinal: Negative for abdominal pain, blood in stool, constipation, diarrhea, heartburn, nausea and vomiting.  Genitourinary: Negative for dysuria, frequency and hematuria.  Musculoskeletal: Positive for joint pain (right elbow, left shoulder). Negative for back pain, falls, myalgias and neck pain.  Skin: Negative for rash.  Neurological: Positive for tingling. Negative for dizziness, sensory change, focal weakness, weakness and headaches.     OBJECTIVE:  Today's Vitals   06/09/20 0953 06/09/20 0955  BP: (!) 172/105 (!) 185/101  Pulse: 73   Temp: 98.7 F (37.1 C)   TempSrc: Temporal   SpO2: 97%   Weight: 120 lb 6.4 oz (54.6 kg)   Height: _0  (1.473 m)    Body mass index is 25.16 kg/m.   Physical Exam Constitutional:      General: She is not in acute distress.    Appearance: Normal appearance. She is not ill-appearing.  HENT:     Head: Normocephalic.  Cardiovascular:  Rate and Rhythm: Normal rate and regular rhythm.     Pulses: Normal pulses.     Heart sounds: Normal heart sounds. No murmur heard. No friction rub. No gallop.   Pulmonary:     Effort: Pulmonary effort is normal. No respiratory distress.     Breath sounds: Normal breath sounds. No stridor. No wheezing, rhonchi or rales.  Abdominal:     General: Bowel sounds are normal.     Palpations: Abdomen is soft.     Tenderness: There is no abdominal tenderness.  Musculoskeletal:     Right shoulder: Normal.     Left shoulder: Normal.     Right  elbow: Normal.     Left elbow: Normal.     Cervical back: Normal.     Thoracic back: Normal.     Right lower leg: No edema.     Left lower leg: No edema.  Skin:    General: Skin is warm and dry.  Neurological:     Mental Status: She is alert and oriented to person, place, and time.  Psychiatric:        Mood and Affect: Mood normal.        Behavior: Behavior normal.     No results found for this or any previous visit (from the past 24 hour(s)).  No results found.   ASSESSMENT and PLAN  Problem List Items Addressed This Visit   None   Visit Diagnoses    Chronic left shoulder pain    -  Primary   Relevant Medications   diclofenac (VOLTAREN) 75 MG EC tablet   diclofenac Sodium (VOLTAREN) 1 % GEL   Other Relevant Orders   Ambulatory referral to Physical Therapy   Essential hypertension       Relevant Medications   valsartan (DIOVAN) 80 MG tablet   hydrochlorothiazide (HYDRODIURIL) 25 MG tablet   Other Relevant Orders   CMP14+EGFR   Right elbow pain       Relevant Medications   diclofenac (VOLTAREN) 75 MG EC tablet   diclofenac Sodium (VOLTAREN) 1 % GEL   Other Relevant Orders   Ambulatory referral to Physical Therapy   Screening, lipid       Relevant Orders   Lipid Panel   Screening for endocrine, metabolic and immunity disorder       Relevant Orders   CBC   Hemoglobin A1c   TSH   HIV Antibody (routine testing w rflx)   Hepatitis C antibody   Vitamin D, 25-hydroxy   Screening for colon cancer       Relevant Orders   Ambulatory referral to Gastroenterology   Encounter for screening mammogram for malignant neoplasm of breast       Relevant Orders   MS DIGITAL SCREENING TOMO BILATERAL      Plan  Referral placed for GI to schedule colonoscopy for colon cancer screening, they will call you  Order placed for mammogram, they will call you to scheduled  I will follow up with lab results  Pain: Voltaren gel as needed, take Diclofenac twice a day for pain (  do not take with aleve or ibuprofen) referral was placed for physical therapy  Next visit in 2 weeks will do pap smear and follow up with pain and BP  For BP: start daily HCTZ, valsartan 61m daily   Return in about 2 weeks (around 06/23/2020) for Pap smear, BP recheck, and pain recheck.    KHuston FoleyJust, FNP-BC [facility]  [place], [place] [address] Ph.  [phone] Fax [phone]

## 2020-06-10 ENCOUNTER — Other Ambulatory Visit: Payer: Self-pay | Admitting: Family Medicine

## 2020-06-10 ENCOUNTER — Encounter: Payer: Self-pay | Admitting: Emergency Medicine

## 2020-06-10 DIAGNOSIS — E7800 Pure hypercholesterolemia, unspecified: Secondary | ICD-10-CM

## 2020-06-10 LAB — VITAMIN D 25 HYDROXY (VIT D DEFICIENCY, FRACTURES): Vit D, 25-Hydroxy: 15.8 ng/mL — ABNORMAL LOW (ref 30.0–100.0)

## 2020-06-10 LAB — HEPATITIS C ANTIBODY: Hep C Virus Ab: 0.1 {s_co_ratio} (ref 0.0–0.9)

## 2020-06-10 LAB — CMP14+EGFR
ALT: 9 IU/L (ref 0–32)
AST: 18 IU/L (ref 0–40)
Albumin/Globulin Ratio: 2 (ref 1.2–2.2)
Alkaline Phosphatase: 54 IU/L (ref 44–121)
BUN/Creatinine Ratio: 14 (ref 9–23)
BUN: 11 mg/dL (ref 6–24)
Bilirubin Total: 0.4 mg/dL (ref 0.0–1.2)
Calcium: 9.5 mg/dL (ref 8.7–10.2)
Chloride: 101 mmol/L (ref 96–106)
Creatinine, Ser: 0.81 mg/dL (ref 0.57–1.00)
GFR calc Af Amer: 97 mL/min/{1.73_m2} (ref >59)
GFR calc non Af Amer: 84 mL/min/{1.73_m2} (ref >59)
Globulin, Total: 2.4 g/dL (ref 1.5–4.5)
Glucose: 122 mg/dL — ABNORMAL HIGH (ref 65–99)
Potassium: 3.6 mmol/L (ref 3.5–5.2)
Sodium: 143 mmol/L (ref 134–144)

## 2020-06-10 LAB — CBC
Hematocrit: 43.8 % (ref 34.0–46.6)
Hemoglobin: 14.7 g/dL (ref 11.1–15.9)
Platelets: 269 10*3/uL (ref 150–450)
RBC: 5.09 x10E6/uL (ref 3.77–5.28)
RDW: 13.1 % (ref 11.7–15.4)
WBC: 4.4 10*3/uL (ref 3.4–10.8)

## 2020-06-10 LAB — LIPID PANEL
Chol/HDL Ratio: 3.1 ratio (ref 0.0–4.4)
Cholesterol, Total: 258 mg/dL — ABNORMAL HIGH (ref 100–199)
LDL Chol Calc (NIH): 163 mg/dL — ABNORMAL HIGH (ref 0–99)
Triglycerides: 71 mg/dL (ref 0–149)
VLDL Cholesterol Cal: 12 mg/dL (ref 5–40)

## 2020-06-10 LAB — HEMOGLOBIN A1C
Est. average glucose Bld gHb Est-mCnc: 117 mg/dL
Hgb A1c MFr Bld: 5.7 % — ABNORMAL HIGH (ref 4.8–5.6)

## 2020-06-10 LAB — TSH: TSH: 1.47 u[IU]/mL (ref 0.450–4.500)

## 2020-06-10 LAB — HIV ANTIBODY (ROUTINE TESTING W REFLEX): HIV Screen 4th Generation wRfx: NONREACTIVE

## 2020-06-10 MED ORDER — ROSUVASTATIN CALCIUM 10 MG PO TABS: 10.0000 mg | ORAL_TABLET | Freq: Every day | ORAL | 3 refills | Status: DC

## 2020-06-10 NOTE — Progress Notes (Signed)
Overall your labs look good. Your Vitamin D did come back quite low. I would recommend taking 2000 IU of Over the counter Vitamin D3 with food daily. We can recheck this at your next appointment. Your cholesterol levels are also quite elevated, for this I would recommend daily crestor which I have sent to your pharmacy. Your A1c is elevated placing you in the pre-diabetes range. No medications are needed at this time, but continue to work on improving your diet and increasing exercise as able. Let me know if you would like a referral to a dietician to further discuss this. Let me know if you have any questions or concerns.

## 2020-06-23 ENCOUNTER — Encounter: Payer: Self-pay | Admitting: Physical Therapy

## 2020-06-23 ENCOUNTER — Other Ambulatory Visit: Payer: Self-pay

## 2020-06-23 ENCOUNTER — Encounter: Payer: Self-pay | Admitting: Family Medicine

## 2020-06-23 ENCOUNTER — Ambulatory Visit: Payer: 59 | Attending: Family Medicine | Admitting: Physical Therapy

## 2020-06-23 ENCOUNTER — Other Ambulatory Visit (HOSPITAL_COMMUNITY)
Admission: RE | Admit: 2020-06-23 | Discharge: 2020-06-23 | Disposition: A | Discharge status: Home / Self Care | Payer: 59 | Source: Ambulatory Visit | Attending: Family Medicine | Admitting: Family Medicine

## 2020-06-23 ENCOUNTER — Ambulatory Visit (INDEPENDENT_AMBULATORY_CARE_PROVIDER_SITE_OTHER): Payer: 59 | Admitting: Family Medicine

## 2020-06-23 VITALS — BP 116/72 | HR 79 | Temp 97.9°F | Ht <= 58 in | Wt 119.0 lb

## 2020-06-23 DIAGNOSIS — G8929 Other chronic pain: Secondary | ICD-10-CM

## 2020-06-23 DIAGNOSIS — R252 Cramp and spasm: Secondary | ICD-10-CM | POA: Insufficient documentation

## 2020-06-23 DIAGNOSIS — R7309 Other abnormal glucose: Secondary | ICD-10-CM | POA: Insufficient documentation

## 2020-06-23 DIAGNOSIS — E7800 Pure hypercholesterolemia, unspecified: Secondary | ICD-10-CM

## 2020-06-23 DIAGNOSIS — M25512 Pain in left shoulder: Secondary | ICD-10-CM | POA: Diagnosis present

## 2020-06-23 DIAGNOSIS — E559 Vitamin D deficiency, unspecified: Secondary | ICD-10-CM | POA: Diagnosis not present

## 2020-06-23 DIAGNOSIS — I10 Essential (primary) hypertension: Secondary | ICD-10-CM

## 2020-06-23 DIAGNOSIS — Z124 Encounter for screening for malignant neoplasm of cervix: Secondary | ICD-10-CM | POA: Insufficient documentation

## 2020-06-23 DIAGNOSIS — M25521 Pain in right elbow: Secondary | ICD-10-CM

## 2020-06-23 DIAGNOSIS — R7303 Prediabetes: Secondary | ICD-10-CM | POA: Diagnosis not present

## 2020-06-23 DIAGNOSIS — M6281 Muscle weakness (generalized): Secondary | ICD-10-CM | POA: Insufficient documentation

## 2020-06-23 MED ORDER — HYDROCHLOROTHIAZIDE 25 MG PO TABS: 25.0000 mg | ORAL_TABLET | Freq: Every day | ORAL | 3 refills | Status: DC

## 2020-06-23 MED ORDER — VALSARTAN 80 MG PO TABS
80.0000 mg | ORAL_TABLET | Freq: Every day | ORAL | 3 refills | Status: DC
Start: 2020-06-23 — End: 2022-04-08

## 2020-06-23 MED ORDER — ROSUVASTATIN CALCIUM 10 MG PO TABS: 10.0000 mg | ORAL_TABLET | Freq: Every day | ORAL | 3 refills | Status: AC

## 2020-06-23 NOTE — Progress Notes (Signed)
2/28/20229:34 AM  Molly Santos 06-Apr-1971, 50 y.o., female [id number]  Chief Complaint  Patient presents with  . Hypertension  . Gynecologic Exam    HPI:   Patient is a 50 y.o. female with past medical history significant for HTN who presents today for Pain.  Family member is interpreting: declines cone interpreter  Right arm elbow pain and left shoulder pain Last OV: Voltaren gel as needed, take Diclofenac twice a day for pain ( do not take with aleve or ibuprofen) referral was placed for physical therapy Pain is much improved Continues PT  Had covid and flu vaccine: needs booster  HTN Last OV started: HCTZ 25mg   Valsartan 80mg  daily Denies issues with BP medications GP at goal < 130/80 BP Readings from Last 3 Encounters:  06/23/20 116/72  06/09/20 (!) 185/101  01/27/20 (!) 180/105   HLD Crestor 10 mg Lab Results  Component Value Date   CHOL 258 (H) 06/09/2020   HDL 83 06/09/2020   LDLCALC 163 (H) 06/09/2020   TRIG 71 06/09/2020   CHOLHDL 3.1 06/09/2020   The 10-year ASCVD risk score 06/11/2020 DC Jr., et al., 2013) is: 1.3%   Values used to calculate the score:     Age: 50 years     Sex: Female     Is Non-Hispanic African American: No     Diabetic: No     Tobacco smoker: No     Systolic Blood Pressure: 116 mmHg     Is BP treated: Yes     HDL Cholesterol: 83 mg/dL     Total Cholesterol: 258 mg/dL  Pre-diabetes Continues to work on 2014 Lab Results  Component Value Date   HGBA1C 5.7 (H) 06/09/2020   Vitamin D Deficiency Last vitamin D OTC replacement 2000 IU/ day Lab Results  Component Value Date   VD25OH 15.8 (L) 06/09/2020     Depression screen PHQ 2/9 06/09/2020 02/01/2018 12/22/2017  Decreased Interest 0 0 0  Down, Depressed, Hopeless 0 0 0  PHQ - 2 Score 0 0 0    Fall Risk  06/09/2020 02/01/2018 12/22/2017 09/20/2017  Falls in the past year? 0 No No No  Number falls in past yr: 0 - - -  Injury with Fall? 0 - - -  Follow up Falls evaluation  completed - - -     Allergies  Allergen Reactions  . Oxycodone Other (See Comments)    Gave her a headache    Prior to Admission medications   Medication Sig Start Date End Date Taking? Authorizing Provider  lisinopril-hydrochlorothiazide (PRINZIDE,ZESTORETIC) 10-12.5 MG tablet Take 1 tablet by mouth daily. Patient not taking: Reported on 06/09/2020 01/09/18   McVey, 06/11/2020, PA-C    Past Medical History:  Diagnosis Date  . Hypertension     History reviewed. No pertinent surgical history.  Social History   Tobacco Use  . Smoking status: Never Smoker  . Smokeless tobacco: Never Used  Substance Use Topics  . Alcohol use: No    History reviewed. No pertinent family history.  Review of Systems  Constitutional: Negative for chills, fever and malaise/fatigue.  Eyes: Negative for blurred vision and double vision.  Respiratory: Negative for cough, shortness of breath and wheezing.   Cardiovascular: Negative for chest pain, palpitations and leg swelling.  Gastrointestinal: Negative for abdominal pain, blood in stool, constipation, diarrhea, heartburn, nausea and vomiting.  Genitourinary: Negative for dysuria, frequency and hematuria.  Musculoskeletal: Positive for joint pain (right elbow, left shoulder). Negative for back  pain, falls, myalgias and neck pain.  Skin: Negative for rash.  Neurological: Negative for dizziness, tingling, sensory change, focal weakness, weakness and headaches.     OBJECTIVE:  Today's Vitals   06/23/20 0915  BP: 116/72  Pulse: 79  Temp: 97.9 F (36.6 C)  SpO2: 99%  Weight: 119 lb (54 kg)  Height: 4\' 10"  (1.473 m)   Body mass index is 24.87 kg/m.   Physical Exam Constitutional:      General: She is not in acute distress.    Appearance: Normal appearance. She is not ill-appearing.  HENT:     Head: Normocephalic.  Cardiovascular:     Rate and Rhythm: Normal rate and regular rhythm.     Pulses: Normal pulses.     Heart  sounds: Normal heart sounds. No murmur heard. No friction rub. No gallop.   Pulmonary:     Effort: Pulmonary effort is normal. No respiratory distress.     Breath sounds: Normal breath sounds. No stridor. No wheezing, rhonchi or rales.  Abdominal:     General: Bowel sounds are normal.     Palpations: Abdomen is soft.     Tenderness: There is no abdominal tenderness.  Musculoskeletal:     Right shoulder: Normal.     Left shoulder: Normal.     Right elbow: Normal.     Left elbow: Normal.     Cervical back: Normal.     Thoracic back: Normal.     Right lower leg: No edema.     Left lower leg: No edema.  Skin:    General: Skin is warm and dry.  Neurological:     Mental Status: She is alert and oriented to person, place, and time.  Psychiatric:        Mood and Affect: Mood normal.        Behavior: Behavior normal.    Pelvic exam: normal external genitalia, vulva, vagina, cervix, uterus and adnexa, VULVA: normal appearing vulva with no masses, tenderness or lesions, VAGINA: normal appearing vagina with normal color and discharge, no lesions, CERVIX: normal appearing cervix without discharge or lesions, UTERUS: uterus is normal size, shape, consistency and nontender, ADNEXA: normal adnexa in size, nontender and no masses, PAP: Pap smear done today, thin-prep method, DNA probe for chlamydia and GC obtained, HPV test, exam chaperoned by [facility], LPN.  No results found for this or any previous visit (from the past 24 hour(s)).  No results found.   ASSESSMENT and PLAN  Problem List Items Addressed This Visit      Cardiovascular and Mediastinum   Essential hypertension, benign   Relevant Medications   hydrochlorothiazide (HYDRODIURIL) 25 MG tablet   rosuvastatin (CRESTOR) 10 MG tablet   valsartan (DIOVAN) 80 MG tablet     Other   Vitamin D deficiency   Pre-diabetes - Primary   Pure hypercholesterolemia   Relevant Medications   hydrochlorothiazide (HYDRODIURIL) 25 MG tablet    rosuvastatin (CRESTOR) 10 MG tablet   valsartan (DIOVAN) 80 MG tablet    Other Visit Diagnoses    Pap smear for cervical cancer screening       Relevant Orders   Cytology - PAP([place])   Essential hypertension       Relevant Medications   hydrochlorothiazide (HYDRODIURIL) 25 MG tablet   rosuvastatin (CRESTOR) 10 MG tablet   valsartan (DIOVAN) 80 MG tablet      Plan  Referral placed for GI to schedule colonoscopy for colon cancer screening, they will call you  Order placed for mammogram, they will call you to scheduled  Continue HTN regimen, BP at goal< 130/80  Pap done today, will follow up with results  Continue current regimen for Vitamin D def, HLD, and pain   Return in about 3 months (around 09/20/2020).    Macario Carls Barbar Brede, FNP-BC Primary Care at [facility] [address] [place], [place] [address] Ph.  [phone] Fax [phone]

## 2020-06-23 NOTE — Patient Instructions (Signed)

## 2020-06-23 NOTE — Therapy (Signed)
[facility] [address] [place], [place], [address] Phone: [phone]   Fax:  [phone]  Physical Therapy Evaluation  Patient Details  Name: Molly Santos MRN: [id number] Date of Birth: [birth year] Referring Provider (PT): Just   Encounter Date: 06/23/2020   PT End of Session - 06/23/20 1637    Visit Number 1    Date for PT Re-Evaluation 08/21/20    PT Start Time 1403    PT Stop Time 1442    PT Time Calculation (min) 39 min    Activity Tolerance Patient tolerated treatment well    Behavior During Therapy Molly Santos for tasks assessed/performed           Past Medical History:  Diagnosis Date  . Hypertension     History reviewed. No pertinent surgical history.  There were no vitals filed for this visit.    Subjective Assessment - 06/23/20 1402    Subjective Pt reports L shoulder pain beginning >1 year ago and R elbow pain since Jan 2022. 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 Pt reports trouble sleeping at night d/t L shoulder pain. Pt reports some pain with moving L shoulder and difficulty reaching behind her back. Pt denies N/T in UE. Pt denies cervical pain. Pt reports that R elbow hurts right over lateral epicondyle. States that elbow hurts with full extension and full flexion.    Patient is accompained by: Family member    Limitations Lifting    Diagnostic tests none    Patient Stated Goals reduce pain    Currently in Pain? Yes    Pain Score 3     Pain Location Shoulder    Pain Orientation Left    Pain Descriptors / Indicators Aching    Pain Type Chronic pain    Pain Onset More than a month ago    Pain Frequency Intermittent    Aggravating Factors  reaching behind back, lifting, moving shoulder too much    Pain Relieving Factors rest    Multiple Pain Sites Yes    Pain Score 0   only pain when moving   Pain Location Elbow    Pain Orientation Right     Pain Descriptors / Indicators Aching    Pain Type Acute pain    Pain Onset More than a month ago    Pain Frequency Intermittent    Aggravating Factors  full extension/flexion, gripping something, lifting, wrist extension    Pain Relieving Factors rest              [facility] PT Assessment - 06/23/20 0001      Assessment   Medical Diagnosis L shoulder pain/R lat elbow pain    Referring Provider (PT) Just    Hand Dominance Right    Prior Therapy none      Precautions   Precautions None      Restrictions   Weight Bearing Restrictions No      Balance Screen   Has the patient fallen in the past 6 months No    Has the patient had a decrease in activity level because of a fear of falling?  No    Is the patient reluctant to leave their home because of a fear of falling?  No      Home Environment   Additional Comments some housework      Prior Function   Level of Independence Independent  Vocation Full time employment    Vocation Requirements lifting/pulling    Leisure some housework      Sensation   Light Touch Appears Intact      Posture/Postural Control   Posture/Postural Control Postural limitations    Postural Limitations Rounded Shoulders;Forward head      ROM / Strength   AROM / PROM / Strength AROM;Strength      AROM   Overall AROM Comments pain with end range R elbow flex/ext; pain with L shoulder ER; full AROM BUE      Strength   Overall Strength Comments 5/5 strength B UE; weakness of scap stab; pain with resisted wrist extension and resisted pronation/supination      Palpation   Palpation comment tender to palpation L ant shoulder, L biceps tendon, R lateral epicondyle, R wrist extensors      Special Tests    Special Tests Rotator Cuff Impingement    Rotator Cuff Impingment tests Empty Can test;Hawkins- Kennedy test;Speed's test      Hawkins-Kennedy test   Findings Positive    Side Left      Empty Can test   Findings Positive    Side Left       Speed's test   Findings Negative    Side Left                      Objective measurements completed on examination: See above findings.               PT Education - 06/23/20 1637    Education Details Pt educated on POC and HEP    Person(s) Educated Patient    Methods Explanation;Demonstration;Handout    Comprehension Verbalized understanding;Returned demonstration            PT Short Term Goals - 06/23/20 1648      PT SHORT TERM GOAL #1   Title Pt will be I with initial HEP    Time 2    Period Weeks    Status New    Target Date 07/07/20             PT Long Term Goals - 06/23/20 1648      PT LONG TERM GOAL #1   Title Pt will be I with advanced HEP    Time 4    Period Weeks    Status New    Target Date 07/21/20      PT LONG TERM GOAL #2   Title Pt will demo L shoulder ER WFL with no reports of increased pain    Time 4    Period Weeks    Status New    Target Date 07/21/20      PT LONG TERM GOAL #3   Title Pt will demo R elbow/wrist ROM WFL with no reports of increased pain    Time 4    Period Weeks    Status New    Target Date 07/21/20      PT LONG TERM GOAL #4   Title Pt will report able to work full shift with no increase in L shoulder/R elbow pain    Time 4    Period Weeks    Status New    Target Date 07/21/20                  Plan - 06/23/20 1638    Clinical Impression Statement Pt presents to clinic with signs and symptoms consistent with diagnosis of  L shoulder impingement and R lateral epicondylitis. Pt demos tenderness over R lat epicondyle, painful resisted wrist extension, tenderness of R wrist extensors, and positive lat epicondylitis special testing. Pt demos L shoulder ROM WFL but pain with end range functional IR/ER. Pt demos positive impingement testing and tenderness over anterior L shoulder and L biceps tendon. Pt has very physical job and is having difficulty performing job duties d/t R elbow and L  shoulder pain. Pt would benefit from skilled PT to address the above impairments.    Examination-Participation Restrictions Occupation    Stability/Clinical Decision Making Stable/Uncomplicated    Clinical Decision Making Low    Rehab Potential Good    PT Frequency 2x / week   pt will schedule for 1x/week d/t scheduling difficulties   PT Duration 4 weeks    PT Treatment/Interventions ADLs/Self Care Home Management;Electrical Stimulation;Iontophoresis 4mg /ml Dexamethasone;Moist Heat;Neuromuscular re-education;Therapeutic exercise;Therapeutic activities;Patient/family education;Manual techniques;Passive range of motion;Taping    PT Next Visit Plan review/progress HEP, UE ex's, scap stab, manual/modalities as indicated    PT Home Exercise Plan wrist flex/ext stretch, pronation/supination, wrist extension, ER with scap retraction, IR with red TB    Consulted and Agree with Plan of Care Patient           Patient will benefit from skilled therapeutic intervention in order to improve the following deficits and impairments:  Decreased range of motion,Increased muscle spasms,Impaired UE functional use,Pain,Decreased strength  Visit Diagnosis: Pain in right elbow  Chronic left shoulder pain  Cramp and spasm  Muscle weakness (generalized)     Problem List Patient Active Problem List   Diagnosis Date Noted  . Vitamin D deficiency 06/23/2020  . Pre-diabetes 06/23/2020  . Pure hypercholesterolemia 06/23/2020  . Essential hypertension, benign 12/22/2017  . Language barrier 04/10/2013   04/12/2013, PT, DPT Molly Santos 06/23/2020, 4:52 PM  [facility] [address] [place], [place], [place] Phone: [phone]   Fax:  [phone]  Name: Molly Santos MRN: Kendal Hymen Date of Birth: [birth year]

## 2020-06-27 LAB — CYTOLOGY - PAP
Chlamydia: NEGATIVE
Comment: NEGATIVE
Comment: NEGATIVE
Comment: NEGATIVE
Comment: NEGATIVE
Comment: NORMAL
Diagnosis: NEGATIVE
HSV1: NEGATIVE
HSV2: NEGATIVE
High risk HPV: NEGATIVE
Neisseria Gonorrhea: NEGATIVE
Trichomonas: NEGATIVE

## 2020-06-27 NOTE — Progress Notes (Signed)
Pap was negative for abnormalities and HPV negative. No screening needed for 5 years.

## 2020-07-10 ENCOUNTER — Other Ambulatory Visit: Payer: Self-pay

## 2020-07-10 ENCOUNTER — Ambulatory Visit: Payer: 59 | Attending: Family Medicine | Admitting: Physical Therapy

## 2020-07-10 DIAGNOSIS — M25521 Pain in right elbow: Secondary | ICD-10-CM

## 2020-07-10 DIAGNOSIS — G8929 Other chronic pain: Secondary | ICD-10-CM

## 2020-07-10 DIAGNOSIS — M25512 Pain in left shoulder: Secondary | ICD-10-CM | POA: Diagnosis not present

## 2020-07-10 DIAGNOSIS — M6281 Muscle weakness (generalized): Secondary | ICD-10-CM | POA: Diagnosis present

## 2020-07-10 NOTE — Patient Instructions (Signed)

## 2020-07-10 NOTE — Therapy (Signed)
[place] [place], [place], [address] Phone: [phone]   Fax:  [phone]  Physical Therapy Treatment  Patient Details  Name: Molly Santos MRN: [id number] Date of Birth: Dec 17, 1970 Referring Provider (PT): Just   Encounter Date: 07/10/2020   PT End of Session - 07/10/20 1731    Visit Number 2    Date for PT Re-Evaluation 08/21/20    PT Start Time 6808    PT Stop Time 1740    PT Time Calculation (min) 45 min           Past Medical History:  Diagnosis Date  . Hypertension     No past surgical history on file.  There were no vitals filed for this visit.   Subjective Assessment - 07/10/20 1727    Subjective same, verb doing exercises    Patient is accompained by: Family member    Currently in Pain? Yes    Pain Score 3                              [facility] Adult PT Treatment/Exercise - 07/10/20 0001      Exercises   Exercises Shoulder;Wrist      Shoulder Exercises: Standing   Other Standing Exercises 2# empty can,ER, PNF 2 sets 10      Wrist Exercises   Other wrist exercises wrist flex/ex,IR/ER,sup/pron 2# 10 x      Modalities   Modalities Electrical Stimulation      Electrical Stimulation   Electrical Stimulation Location Left shld, RT elbow    Electrical Stimulation Action premod    Electrical Stimulation Parameters sitting    Electrical Stimulation Goals Pain      Manual Therapy   Manual Therapy Myofascial release;Soft tissue mobilization;Passive ROM    Manual therapy comments STW to RT forearm    Soft tissue mobilization right forearm extensors    Myofascial Release right forearm extensors    Passive ROM RT wrsit and forearm, left shld            Trigger Point Dry Needling - 07/10/20 0001    Consent Given? Yes    Education Handout Provided Yes    Muscles Treated Wrist/Hand Extensor digitorum;Extensor carpi ulnaris    Extensor digitorum Response Twitch response  elicited    Extensor carpi ulnaris Response Twitch response elicited                PT Education - 07/10/20 1727    Education Details [id number] wrist flexion stretch, sup/pronation, empty can, ER  2#    Person(s) Educated Patient    Methods Explanation;Demonstration;Handout    Comprehension Verbalized understanding;Returned demonstration            PT Short Term Goals - 07/10/20 1731      PT SHORT TERM GOAL #1   Title Pt will be I with initial HEP    Status Achieved             PT Long Term Goals - 06/23/20 1648      PT LONG TERM GOAL #1   Title Pt will be I with advanced HEP    Time 4    Period Weeks    Status New    Target Date 07/21/20      PT LONG TERM GOAL #2   Title Pt will demo L shoulder ER WFL with no reports of increased pain  Time 4    Period Weeks    Status New    Target Date 07/21/20      PT LONG TERM GOAL #3   Title Pt will demo R elbow/wrist ROM WFL with no reports of increased pain    Time 4    Period Weeks    Status New    Target Date 07/21/20      PT LONG TERM GOAL #4   Title Pt will report able to work full shift with no increase in L shoulder/R elbow pain    Time 4    Period Weeks    Status New    Target Date 07/21/20                 Plan - 07/10/20 1732    Clinical Impression Statement max cuing verband tactile needed with ther ex to get pt to relax and do correct mvmt, pt verb doing HEP from eval without changed- added some add'l ex and stretching. STG met.DN to help relax multi TP in RT forearm. wrist and elbow ROM WFLS on RT and shld ROM on left WFLS but painful and fatigued.also educ on overuse and using lat brace with activty ie working or cooking and she VU- Handout issued. Language was a barrier, someone presnt with her but said minimal.    PT Treatment/Interventions ADLs/Self Care Home Management;Electrical Stimulation;Iontophoresis 28m/ml Dexamethasone;Moist Heat;Neuromuscular re-education;Therapeutic  exercise;Therapeutic activities;Patient/family education;Manual techniques;Passive range of motion;Taping    PT Next Visit Plan review/progress HEP, UE ex's, scap stab, manual/modalities as indicated    PT Home Exercise Plan added wrist flexion stretch, empty can, ER and sup/pronation with 2#           Patient will benefit from skilled therapeutic intervention in order to improve the following deficits and impairments:  Decreased range of motion,Increased muscle spasms,Impaired UE functional use,Pain,Decreased strength  Visit Diagnosis: Chronic left shoulder pain  Pain in right elbow  Muscle weakness (generalized)     Problem List Patient Active Problem List   Diagnosis Date Noted  . Vitamin D deficiency 06/23/2020  . Pre-diabetes 06/23/2020  . Pure hypercholesterolemia 06/23/2020  . Essential hypertension, benign 12/22/2017  . Language barrier 04/10/2013    Molly Santos PTA 07/10/2020, 5:45 PM  [place] [address]Phone: [phone]  Fax:  [phone] Name: Molly SeeligMRN: [id number] of Birth: 120-Aug-1972

## 2020-07-17 ENCOUNTER — Other Ambulatory Visit: Payer: Self-pay

## 2020-07-17 ENCOUNTER — Ambulatory Visit: Payer: 59 | Admitting: Physical Therapy

## 2020-07-17 DIAGNOSIS — M25521 Pain in right elbow: Secondary | ICD-10-CM

## 2020-07-17 DIAGNOSIS — M6281 Muscle weakness (generalized): Secondary | ICD-10-CM

## 2020-07-17 DIAGNOSIS — G8929 Other chronic pain: Secondary | ICD-10-CM

## 2020-07-17 DIAGNOSIS — M25512 Pain in left shoulder: Secondary | ICD-10-CM | POA: Diagnosis not present

## 2020-07-17 NOTE — Therapy (Signed)
[place]. [place], [place], [address] Phone: [phone]   Fax:  [phone]  Physical Therapy Treatment  Patient Details  Name: Pari Lombard MRN: [id number] Date of Birth: 1971-01-22 Referring Provider (PT): Just   Encounter Date: 07/17/2020   PT End of Session - 07/17/20 1737    Visit Number 3    Date for PT Re-Evaluation 08/21/20    PT Start Time 1700    PT Stop Time 1750    PT Time Calculation (min) 50 min           Past Medical History:  Diagnosis Date  . Hypertension     No past surgical history on file.  There were no vitals filed for this visit.   Subjective Assessment - 07/17/20 1705    Subjective arrives with elbow brace and stated it helps but limits ROM. estim was good    Patient is accompained by: Family member    Currently in Pain? Yes                             [facility] Adult PT Treatment/Exercise - 07/17/20 0001      Shoulder Exercises: Standing   Other Standing Exercises cable pulleys 5# row and shld ext 15 x    Other Standing Exercises 3# cane ex shld ext and IR 1x each      Shoulder Exercises: ROM/Strengthening   UBE (Upper Arm Bike) L1 2 min fwd 2 min backward    Lat Pull 10 reps   20#     Wrist Exercises   Other wrist exercises wrist flex/ex,IR/ER,sup/pron 3# 10 x      Modalities   Modalities Electrical Stimulation;Iontophoresis      Electrical Stimulation   Electrical Stimulation Location Left shld, RT elbow    Electrical Stimulation Action premod    Electrical Stimulation Parameters sitting    Electrical Stimulation Goals Pain      Iontophoresis   Type of Iontophoresis Dexamethasone    Location RT lat elbow and left ant shld    Dose 1.2 cc dex    Time 4 hours leave on patch      Manual Therapy   Manual Therapy Soft tissue mobilization;Joint mobilization;Passive ROM    Joint Mobilization Left shld joint capsule stretching    Soft tissue  mobilization RT forearm and ant/lat left shld    Myofascial Release RT forearm ext and ant shld    Passive ROM RT wrist with stretch                    PT Short Term Goals - 07/10/20 1731      PT SHORT TERM GOAL #1   Title Pt will be I with initial HEP    Status Achieved             PT Long Term Goals - 07/17/20 1736      PT LONG TERM GOAL #1   Title Pt will be I with advanced HEP    Status Partially Met      PT LONG TERM GOAL #2   Title Pt will demo L shoulder ER WFL with no reports of increased pain    Baseline full ROM but c/o pain    Status Partially Met      PT LONG TERM GOAL #3   Title Pt will demo R elbow/wrist ROM WFL with no reports of increased  pain    Baseline full ROM but pain    Status Partially Met      PT LONG TERM GOAL #4   Title Pt will report able to work full shift with no increase in L shoulder/R elbow pain    Status On-going                 Plan - 07/17/20 1737    Clinical Impression Statement progressed ther ex today and she tolerated well with cuing( verb and tactie) . full left shld ROM but pain- ant tightness noted and responded well after joint capsule stretching. less tenderness in RT wrist ext but pain in joint - pt is using elbow brace. pt with relief with estim and added ionto today for pain . progressing with LTGS    PT Treatment/Interventions ADLs/Self Care Home Management;Electrical Stimulation;Iontophoresis 55m/ml Dexamethasone;Moist Heat;Neuromuscular re-education;Therapeutic exercise;Therapeutic activities;Patient/family education;Manual techniques;Passive range of motion;Taping    PT Next Visit Plan review/progress HEP, UE ex's, scap stab, manual/modalities as indicated           Patient will benefit from skilled therapeutic intervention in order to improve the following deficits and impairments:  Decreased range of motion,Increased muscle spasms,Impaired UE functional use,Pain,Decreased strength  Visit  Diagnosis: Chronic left shoulder pain  Pain in right elbow  Muscle weakness (generalized)     Problem List Patient Active Problem List   Diagnosis Date Noted  . Vitamin D deficiency 06/23/2020  . Pre-diabetes 06/23/2020  . Pure hypercholesterolemia 06/23/2020  . Essential hypertension, benign 12/22/2017  . Language barrier 04/10/2013    Marzetta Lanza,ANGIE PTA 07/17/2020, 5:41 PM  [place] [address]Phone: [phone]  Fax:  [phone] Name: AReneka NebergallMRN: [id number] of Birth: 102-Sep-1972

## 2020-07-24 ENCOUNTER — Ambulatory Visit: Payer: 59

## 2020-07-31 ENCOUNTER — Ambulatory Visit: Payer: 59 | Admitting: Physical Therapy

## 2021-10-02 ENCOUNTER — Ambulatory Visit (INDEPENDENT_AMBULATORY_CARE_PROVIDER_SITE_OTHER): Payer: 59 | Admitting: Family Medicine

## 2021-10-02 ENCOUNTER — Encounter: Payer: Self-pay | Admitting: Family Medicine

## 2021-10-02 VITALS — BP 140/80 | HR 71 | Ht <= 58 in | Wt 116.0 lb

## 2021-10-02 DIAGNOSIS — I10 Essential (primary) hypertension: Secondary | ICD-10-CM | POA: Diagnosis not present

## 2021-10-02 DIAGNOSIS — R7309 Other abnormal glucose: Secondary | ICD-10-CM | POA: Diagnosis not present

## 2021-10-02 DIAGNOSIS — E7800 Pure hypercholesterolemia, unspecified: Secondary | ICD-10-CM | POA: Diagnosis not present

## 2021-10-02 DIAGNOSIS — Z0000 Encounter for general adult medical examination without abnormal findings: Secondary | ICD-10-CM

## 2021-10-02 DIAGNOSIS — Z1211 Encounter for screening for malignant neoplasm of colon: Secondary | ICD-10-CM

## 2021-10-02 DIAGNOSIS — H539 Unspecified visual disturbance: Secondary | ICD-10-CM

## 2021-10-02 DIAGNOSIS — Z789 Other specified health status: Secondary | ICD-10-CM

## 2021-10-02 LAB — POCT GLYCOSYLATED HEMOGLOBIN (HGB A1C): Hemoglobin A1C: 5.5 % (ref 4.0–5.6)

## 2021-10-02 MED ORDER — HYDROCHLOROTHIAZIDE 12.5 MG PO CAPS: 12.5000 mg | ORAL_CAPSULE | Freq: Every day | ORAL | 1 refills | Status: DC

## 2021-10-02 NOTE — Progress Notes (Signed)
Subjective:    Patient ID: Molly Santos, female    DOB: [birth year], 51 y.o.   MRN: [id number]   CC: New Patient  HPI:  Patient presents with family member (daughter), who acts as an Equities trader. They decline [facility] interpreter.  HTN: was taking HCTZ 25mg  daily, valsartan 80mg  daily. Last BMP in 2022 was normal. Has not taken medication for the last year.  Elevated A1c: Last A1c was prediabetic at 5.7.  Patient does not have any polydipsia or signs of diabetes at this time.  Hyperlipidemia: Patient reports that she has not taken her cholesterol medication in > 1 year.  Vision: Patient reports blurred vision every once in a while.  Has not been seen by an optometrist/ophthalmologist.   PMHx: Past Medical History:  Diagnosis Date   Hypertension      Surgical Hx: History reviewed. No pertinent surgical history.   Family Hx: History reviewed. No pertinent family history.   Social Hx: Current Social History 10/02/2021   Who lives at home: patient, daughter, grandson, son, husband  Who would speak for you about health care matters: Daughters  Transportation: drives herself  Current Stressors: none Work / Education:  none  Religious / Personal Beliefs: Christian  Tobacco: none Alcohol: none Drugs: none    Medications: Not currently taking any mediations  ROS: Positives listed in HPI Denies: Hearing changes, anorexia, unexpected weight changes, fever, chest pain, cough, GI symptoms/abdominal pain, dysuria or hematuria weakness or syncope  Preventative Screening Colonoscopy: Never done Mammogram: 2011 Pap test: 2022 NILM with negative HPV  Objective:  BP 140/80   Pulse 71   Ht 4\' 10"  (1.473 m)   Wt 116 lb (52.6 kg)   LMP 07/29/2015   SpO2 99%   BMI 24.24 kg/m  Vitals and nursing note reviewed  General: well nourished, in no acute distress HEENT: normocephalic, TM's visualized bilaterally, no scleral icterus or conjunctival pallor, no nasal discharge, moist  mucous membranes, good dentition without erythema or discharge noted in posterior oropharynx Neck: supple, non-tender, without lymphadenopathy Cardiac: RRR, clear S1 and S2, no murmurs, rubs, or gallops Respiratory: clear to auscultation bilaterally, no increased work of breathing Abdomen: soft, nontender, nondistended, no masses or organomegaly. Bowel sounds present Extremities: no edema or cyanosis. Warm, well perfused. 2+ radial and PT pulses bilaterally Skin: warm and dry, no rashes noted Neuro: alert and oriented, no focal deficits   Assessment & Plan:   Elevated hemoglobin A1c Previously elevated one time to 5.7%, re-check today was normal.  - Re-check if patient has symptoms concerning for diabetes  Pure hypercholesterolemia Patient has not been taking rosuvastatin for at least a year. - Lipid panel - Will calculate ASCVD risk based on updated labs whenever time and for the time and if statin therapy is needed at that time.  Essential hypertension, benign BP 140/80.  Patient does not check at home.  Previously on HCTZ 25, valsartan 80 mg daily.  Patient has been out of her medications for >1 year. - BMP today - Recommend patient get BP cuff and make blood pressure log - Starting HCTZ 12.5 mg daily - If BP persistently > 140/90, follow-up sooner. - Otherwise follow-up in 1-2 months  Healthcare maintenance - Up-to-date on Pap smear - Referral to GI for colonoscopy placed - Mammogram information given to patient - Patient family unclear if fully up-to-date on vaccinations  Vision changes Recommended the patient go see optometry/ophthalmologist to ensure that there there is no need for glasses  Return in  about 2 months (around 12/02/2021) for Hypertension follow-up.   Wetzel Meester, DO

## 2021-10-02 NOTE — Assessment & Plan Note (Signed)
Patient has not been taking rosuvastatin for at least a year. - Lipid panel - Will calculate ASCVD risk based on updated labs whenever time and for the time and if statin therapy is needed at that time.

## 2021-10-02 NOTE — Assessment & Plan Note (Signed)
BP 140/80.  Patient does not check at home.  Previously on HCTZ 25, valsartan 80 mg daily.  Patient has been out of her medications for >1 year. - BMP today - Recommend patient get BP cuff and make blood pressure log - Starting HCTZ 12.5 mg daily - If BP persistently > 140/90, follow-up sooner. - Otherwise follow-up in 1-2 months

## 2021-10-02 NOTE — Assessment & Plan Note (Signed)
Previously elevated one time to 5.7%, re-check today was normal.  - Re-check if patient has symptoms concerning for diabetes

## 2021-10-02 NOTE — Patient Instructions (Addendum)
It was so great meeting you today! Today we discussed the following;  - Hypertension: I would like you to get a blood pressure cuff and check about one time per day (also write down these numbers to keep track and bring to me at your next visit). I am restarting one of the blood pressure medications to take daily. If her blood pressures at home are >140/90 most days, please call the office and we will need to adjust medications.   - Your diabetes check today was normal, so no further concerns at this time.   - I am checking your cholesterol today but will call if we need to restart the medication you were previously on.   - You are also due for a mammogram (breast cancer screening), colonoscopy (colon cancer screening).

## 2021-10-02 NOTE — Assessment & Plan Note (Signed)
-   Up-to-date on Pap smear - Referral to GI for colonoscopy placed - Mammogram information given to patient - Patient family unclear if fully up-to-date on vaccinations

## 2021-10-03 LAB — BASIC METABOLIC PANEL
BUN/Creatinine Ratio: 12 (ref 9–23)
BUN: 9 mg/dL (ref 6–24)
CO2: 25 mmol/L (ref 20–29)
Calcium: 9.4 mg/dL (ref 8.7–10.2)
Chloride: 105 mmol/L (ref 96–106)
Creatinine, Ser: 0.76 mg/dL (ref 0.57–1.00)
Glucose: 115 mg/dL — ABNORMAL HIGH (ref 70–99)
Potassium: 3.9 mmol/L (ref 3.5–5.2)
Sodium: 143 mmol/L (ref 134–144)
eGFR: 94 mL/min/{1.73_m2} (ref >59)

## 2021-10-03 LAB — LIPID PANEL
Chol/HDL Ratio: 3 ratio (ref 0.0–4.4)
Cholesterol, Total: 212 mg/dL — ABNORMAL HIGH (ref 100–199)
HDL: 71 mg/dL (ref >39)
LDL Chol Calc (NIH): 124 mg/dL — ABNORMAL HIGH (ref 0–99)
Triglycerides: 98 mg/dL (ref 0–149)
VLDL Cholesterol Cal: 17 mg/dL (ref 5–40)

## 2021-10-06 ENCOUNTER — Encounter: Payer: Self-pay | Admitting: Family Medicine

## 2021-10-08 ENCOUNTER — Telehealth: Payer: Self-pay

## 2021-10-08 NOTE — Telephone Encounter (Signed)
Patient's daughter calls nurse line regarding possible medication side effects to HCTZ. Patient reports itchiness over body and difficulties with sleep. Denies difficulty breathing or swallowing.   Patient stopped taking medication on Tuesday evening. Reports that BP has been staying "about the same" since stopping medication. Daughter was unable to provide with specific BP measurements.  Please advise how patient should proceed with BP medication management or if patient needs additional appointment.   Veronda Prude, RN

## 2021-10-09 NOTE — Telephone Encounter (Signed)
Returned call to daughter. Advised of message per Dr. Clayborne Artist. Scheduled for follow up on 10/20/21.  Veronda Prude, RN

## 2021-10-20 ENCOUNTER — Encounter: Payer: Self-pay | Admitting: Family Medicine

## 2021-10-20 ENCOUNTER — Ambulatory Visit (INDEPENDENT_AMBULATORY_CARE_PROVIDER_SITE_OTHER): Payer: 59 | Admitting: Family Medicine

## 2021-10-20 VITALS — BP 140/80 | HR 67 | Ht <= 58 in | Wt 116.5 lb

## 2021-10-20 DIAGNOSIS — I10 Essential (primary) hypertension: Secondary | ICD-10-CM

## 2021-10-20 DIAGNOSIS — Z789 Other specified health status: Secondary | ICD-10-CM | POA: Diagnosis not present

## 2021-10-20 MED ORDER — AMLODIPINE BESYLATE 5 MG PO TABS: 5.0000 mg | ORAL_TABLET | Freq: Every day | ORAL | 3 refills | Status: DC

## 2021-10-20 NOTE — Progress Notes (Signed)
    SUBJECTIVE:   CHIEF COMPLAINT / HPI:   Daughter presents with patient and acts as Radio producer per patient preference.  HTN: - Medications: Previously started on HCTZ  - Compliance: Stopped after 4 days of use due to whole body pruritus without rash - Checking BP at home: Most recently has been 140s-150s systolic - Denies any SOB, CP, vision changes, LE edema, medication SEs, or symptoms of hypotension  PERTINENT  PMH / PSH: Reviewed  OBJECTIVE:   BP 140/80   Pulse 67   Ht 4\' 10"  (1.473 m)   Wt 116 lb 8 oz (52.8 kg)   LMP 07/29/2015   SpO2 96%   BMI 24.35 kg/m   Gen: well-appearing, NAD CV: RRR, no m/r/g appreciated, no peripheral edema Pulm: CTAB, no wheezes/crackles  ASSESSMENT/PLAN:   Essential hypertension, benign BP remains mildly elevated, was not able to tolerate HCTZ due to side effects of whole body pruritus. - Amlodipine 5 mg daily - Monitor BP daily, family to keep BP log - Call if still elevated > 140/90 in 1 to 2 weeks - If remains elevated, consider combination pill of amlodipine-Benazepril (will require follow-up BMP)     Evelena Leyden, DO [facility]

## 2021-10-20 NOTE — Assessment & Plan Note (Signed)
BP remains mildly elevated, was not able to tolerate HCTZ due to side effects of whole body pruritus. - Amlodipine 5 mg daily - Monitor BP daily, family to keep BP log - Call if still elevated > 140/90 in 1 to 2 weeks - If remains elevated, consider combination pill of amlodipine-Benazepril (will require follow-up BMP)

## 2022-02-25 NOTE — Progress Notes (Signed)
  SUBJECTIVE:   CHIEF COMPLAINT / HPI:   Blood pressure f/u -Started on amlodipine 5mg  qd at last visit in June -was taking this regularly until about 3 weeks ago when she had confusion with her pharmacy and wasn't able to pick up her medication -She has since cleared this up with the pharmacy and plans to pick up the amlodipine -While taking the amlodipine, she was checking her BP and was seeing numbers typically in the 130s/70s-80s. Most recently checked a few days ago and it was 151/95. -Has been having some more frequent headaches over the last few weeks since being off the amlodipine, but says this medicine helped with them -Denies current headache/vision changes, dizziness, CP/SOB  Health maintenance -OK for flu and covid shots today -wishes to talk about colonoscopy/mammogram and other screening/maintenance at next visit  PERTINENT  PMH / PSH: essential htn    OBJECTIVE:  BP (!) 140/84   Pulse 75   Ht 4\' 10"  (1.473 m)   Wt 116 lb (52.6 kg)   LMP 07/29/2015   SpO2 98%   BMI 24.24 kg/m   General: NAD, pleasant, able to participate in exam Cardiac: RRR, no murmurs auscultated Respiratory: CTAB, normal WOB Abdomen: soft, non-tender, non-distended, normoactive bowel sounds Extremities: warm and well perfused, no edema or cyanosis Skin: warm and dry, no rashes noted Neuro: alert, no obvious focal deficits, speech normal Psych: Normal affect and mood  ASSESSMENT/PLAN:   Essential hypertension, benign Assessment & Plan: Was started on amlodipine 5 mg at her last visit, but has been unable to take this for the last 3 weeks due to some confusion with her pharmacy.  She has checked and will be able to pick up her medicine now.  While she was on amlodipine, she was checking her BP and it was well controlled in the 130s/70s.  BP 140/84 today without red flag symptoms. -Pick up amlodipine, continue 5 mg daily, will provide blood pressure log with instructions -Follow-up in 3 to  4 weeks for recheck or sooner if needed, may consider combination pill of amlodipine-benazepril if BP remains elevated (will require BMP)   Healthcare maintenance Assessment & Plan: Flu vaccine and COVID booster given today. Due for colonoscopy, mammogram, and tdap, info was provided at recent visit but patient wishes to still consider these and would like to discuss at next visit      Return in about 4 weeks (around 03/26/2022) for BP check.

## 2022-02-26 ENCOUNTER — Encounter: Payer: Self-pay | Admitting: Family Medicine

## 2022-02-26 ENCOUNTER — Ambulatory Visit (INDEPENDENT_AMBULATORY_CARE_PROVIDER_SITE_OTHER): Payer: 59 | Admitting: Family Medicine

## 2022-02-26 ENCOUNTER — Ambulatory Visit (INDEPENDENT_AMBULATORY_CARE_PROVIDER_SITE_OTHER): Payer: 59 | Admitting: *Deleted

## 2022-02-26 VITALS — BP 140/84 | HR 75 | Ht <= 58 in | Wt 116.0 lb

## 2022-02-26 DIAGNOSIS — Z23 Encounter for immunization: Secondary | ICD-10-CM | POA: Diagnosis not present

## 2022-02-26 DIAGNOSIS — Z0000 Encounter for general adult medical examination without abnormal findings: Secondary | ICD-10-CM

## 2022-02-26 DIAGNOSIS — I10 Essential (primary) hypertension: Secondary | ICD-10-CM

## 2022-02-26 NOTE — Assessment & Plan Note (Addendum)
Was started on amlodipine 5 mg at her last visit, but has been unable to take this for the last 3 weeks due to some confusion with her pharmacy.  She has checked and will be able to pick up her medicine now.  While she was on amlodipine, she was checking her BP and it was well controlled in the 130s/70s.  BP 140/84 today without red flag symptoms. -Pick up amlodipine, continue 5 mg daily, will provide blood pressure log with instructions -Follow-up in 3 to 4 weeks for recheck or sooner if needed, may consider combination pill of amlodipine-benazepril if BP remains elevated (will require BMP)

## 2022-02-26 NOTE — Patient Instructions (Signed)
It was great to see you today! Thank you for choosing [facility] for your primary care. Molly Santos was seen for blood pressure follow up.  Today we addressed: Please pick up your amlodipine and take it as prescribed. Please use the attached blood pressure log and bring it with you to every visit. Please follow up in 3-4 weeks to check back in on your blood pressure, but give Molly Santos a call if you see very low or high numbers after starting your medicine You'll get a flu and covid shot today - we can discuss a colonoscopy, mammogram, and other vaccines at your next visit  If you haven't already, sign up for My Chart to have easy access to your labs results, and communication with your primary care physician.  We are checking some labs today. If they are abnormal, I will call you. If they are normal, I will send you a MyChart message (if it is active) or a letter in the mail. If you do not hear about your labs in the next 2 weeks, please call the office.   You should return to our clinic No follow-ups on file.  I recommend that you always bring your medications to each appointment as this makes it easy to ensure you are on the correct medications and helps Molly Santos not miss refills when you need them.  Please arrive 15 minutes before your appointment to ensure smooth check in process.  We appreciate your efforts in making this happen.  Please call the clinic at [phone] if your symptoms worsen or you have any concerns.  Thank you for allowing me to participate in your care, Molly Albino, MD 02/26/2022, 10:25 AM PGY-1, [place]      Blood Pressure Record Sheet To take your blood pressure, you will need a blood pressure machine. You can buy a blood pressure machine (blood pressure monitor) at your clinic, drug store, or online. When choosing one, consider: An automatic monitor that has an arm cuff. A cuff that wraps snugly around your upper arm. You should be able to fit  only one finger between your arm and the cuff. A device that stores blood pressure reading results. Do not choose a monitor that measures your blood pressure from your wrist or finger. Follow your health care provider's instructions for how to take your blood pressure. To use this form: Take your blood pressure medications every day These measurements should be taken when you have been at rest for at least 10-15 min Take at least 2 readings with each blood pressure check. This makes sure the results are correct. Wait 1-2 minutes between measurements. Write down the results in the spaces on this form. Keep in mind it should always be recorded systolic over diastolic. Both numbers are important.  Repeat this every day for 2-3 weeks, or as told by your health care provider.  Make a follow-up appointment with your health care provider to discuss the results.  Blood Pressure Log Date Medications taken? (Y/N) Blood Pressure Time of Day

## 2022-02-26 NOTE — Assessment & Plan Note (Signed)
Flu vaccine and COVID booster given today. Due for colonoscopy, mammogram, and tdap, info was provided at recent visit but patient wishes to still consider these and would like to discuss at next visit

## 2022-03-31 ENCOUNTER — Ambulatory Visit: Payer: 59 | Admitting: Family Medicine

## 2022-04-02 ENCOUNTER — Ambulatory Visit: Payer: 59 | Admitting: Family Medicine

## 2022-04-08 ENCOUNTER — Ambulatory Visit (INDEPENDENT_AMBULATORY_CARE_PROVIDER_SITE_OTHER): Payer: 59 | Admitting: Family Medicine

## 2022-04-08 VITALS — BP 128/80 | HR 88 | Ht <= 58 in | Wt 116.2 lb

## 2022-04-08 DIAGNOSIS — I10 Essential (primary) hypertension: Secondary | ICD-10-CM

## 2022-04-08 DIAGNOSIS — Z0000 Encounter for general adult medical examination without abnormal findings: Secondary | ICD-10-CM | POA: Diagnosis not present

## 2022-04-08 MED ORDER — AMLODIPINE BESYLATE 5 MG PO TABS: 5.0000 mg | ORAL_TABLET | Freq: Every day | ORAL | 3 refills | Status: DC

## 2022-04-08 NOTE — Progress Notes (Signed)
    SUBJECTIVE:   CHIEF COMPLAINT / HPI:   HTN: - Medications: Amlodipine 5mg  daily  - Compliance: Good recently, had a few weeks last month without medication due to pharmacy issue  - Checking BP at home: yes 120-130s/70-80s - Denies any SOB, CP, vision changes, LE edema, medication SEs, or symptoms of hypotension  PERTINENT  PMH / PSH: Reviewed  OBJECTIVE:   BP 128/80   Pulse 88   Ht 4\' 10"  (1.473 m)   Wt 116 lb 4 oz (52.7 kg)   LMP 07/29/2015   SpO2 100%   BMI 24.30 kg/m   General: NAD, well-appearing, well-nourished Respiratory: No respiratory distress, breathing comfortably, able to speak in full sentences Skin: warm and dry, no rashes noted on exposed skin Psych: Appropriate affect and mood  ASSESSMENT/PLAN:   Essential hypertension, benign BP today well controlled - Continue amlodipine 5mg  daily  - Continue blood pressure log - 6 month follow-up - Can consider BMP at next visit  Healthcare maintenance Declines colonoscopy and vaccines today, but will continue discussion in the future. Recommended mammogram for patient.      , DO [place] [facility]

## 2022-04-08 NOTE — Patient Instructions (Addendum)
Your blood pressure today was great!   - Keep taking your Amlodipine for your blood pressure.    You are also due for several cancer screenings including a mammogram and colonoscopy. These are things that we recommend you get, you can always call [facility] and get the mammogram completed. If you would like to get the colonoscopy, just let our office know and we will place the referral.

## 2022-04-08 NOTE — Assessment & Plan Note (Signed)
Declines colonoscopy and vaccines today, but will continue discussion in the future. Recommended mammogram for patient.

## 2022-04-08 NOTE — Assessment & Plan Note (Addendum)
BP today well controlled - Continue amlodipine 5mg  daily  - Continue blood pressure log - 6 month follow-up - Can consider BMP at next visit

## 2022-05-02 IMAGING — CR DG LUMBAR SPINE COMPLETE 4+V
5 series · 5 of 5 positions shown · non-contrast
Comparison: May 10, 2011

CLINICAL DATA: Acute pain due to trauma

EXAM:
LUMBAR SPINE - COMPLETE 4+ VIEW

[t lumbar spine ap]
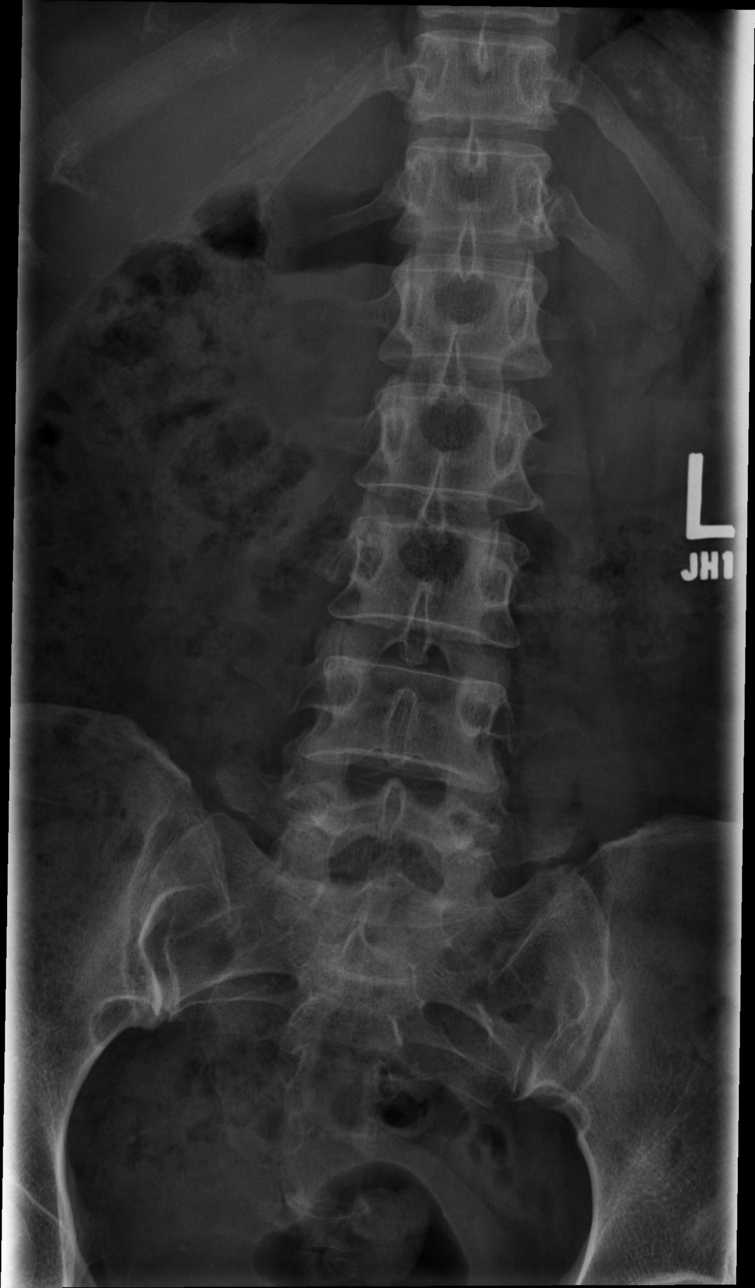

[t lumbar spine obl (1 of 2)]
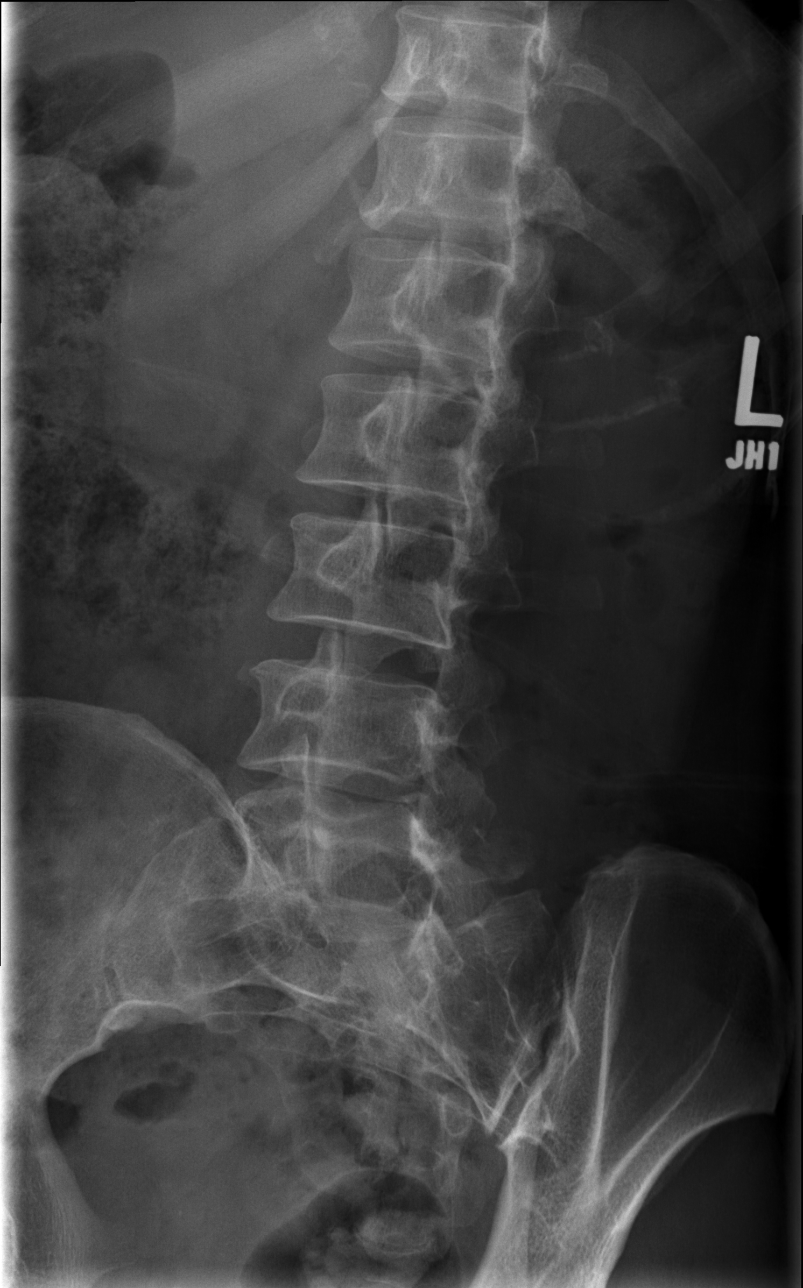

[t lumbar spine obl (2 of 2)]
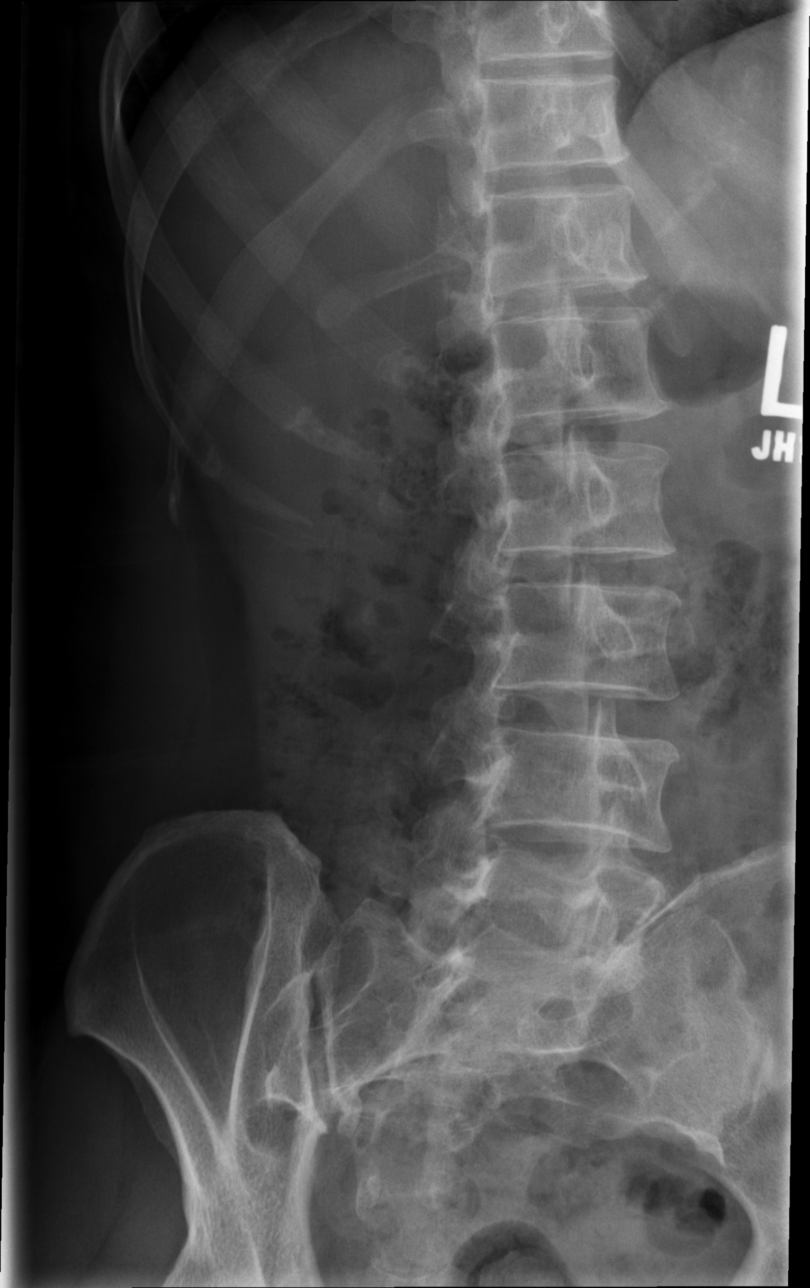

[t lumbar spine lat]
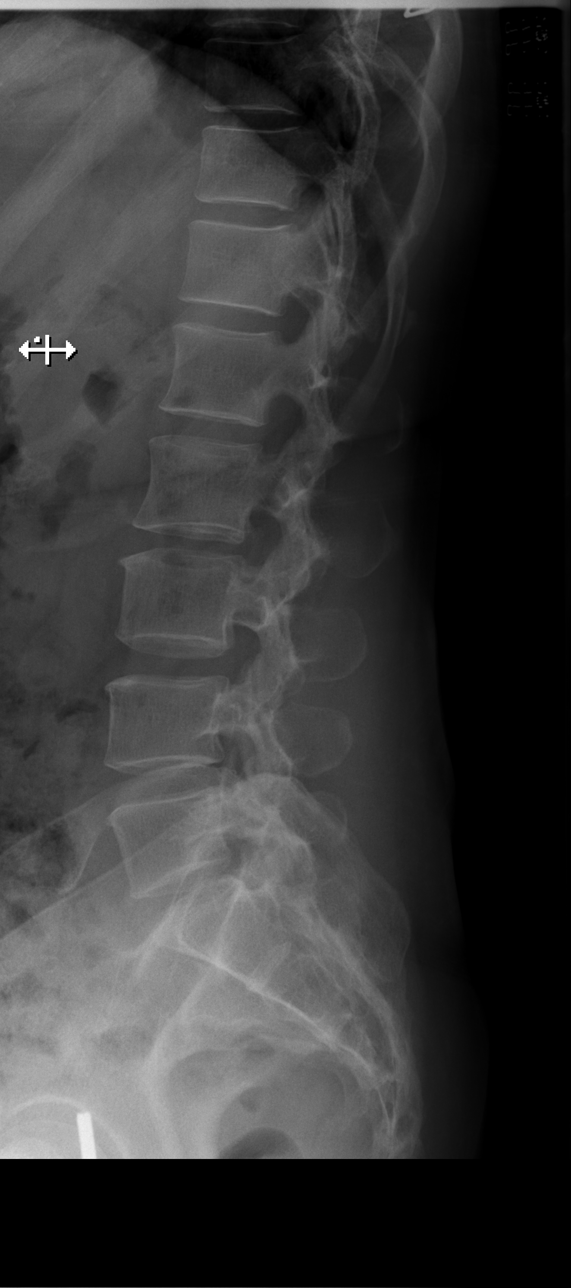

[t lumbar l-5 s-1 spot]
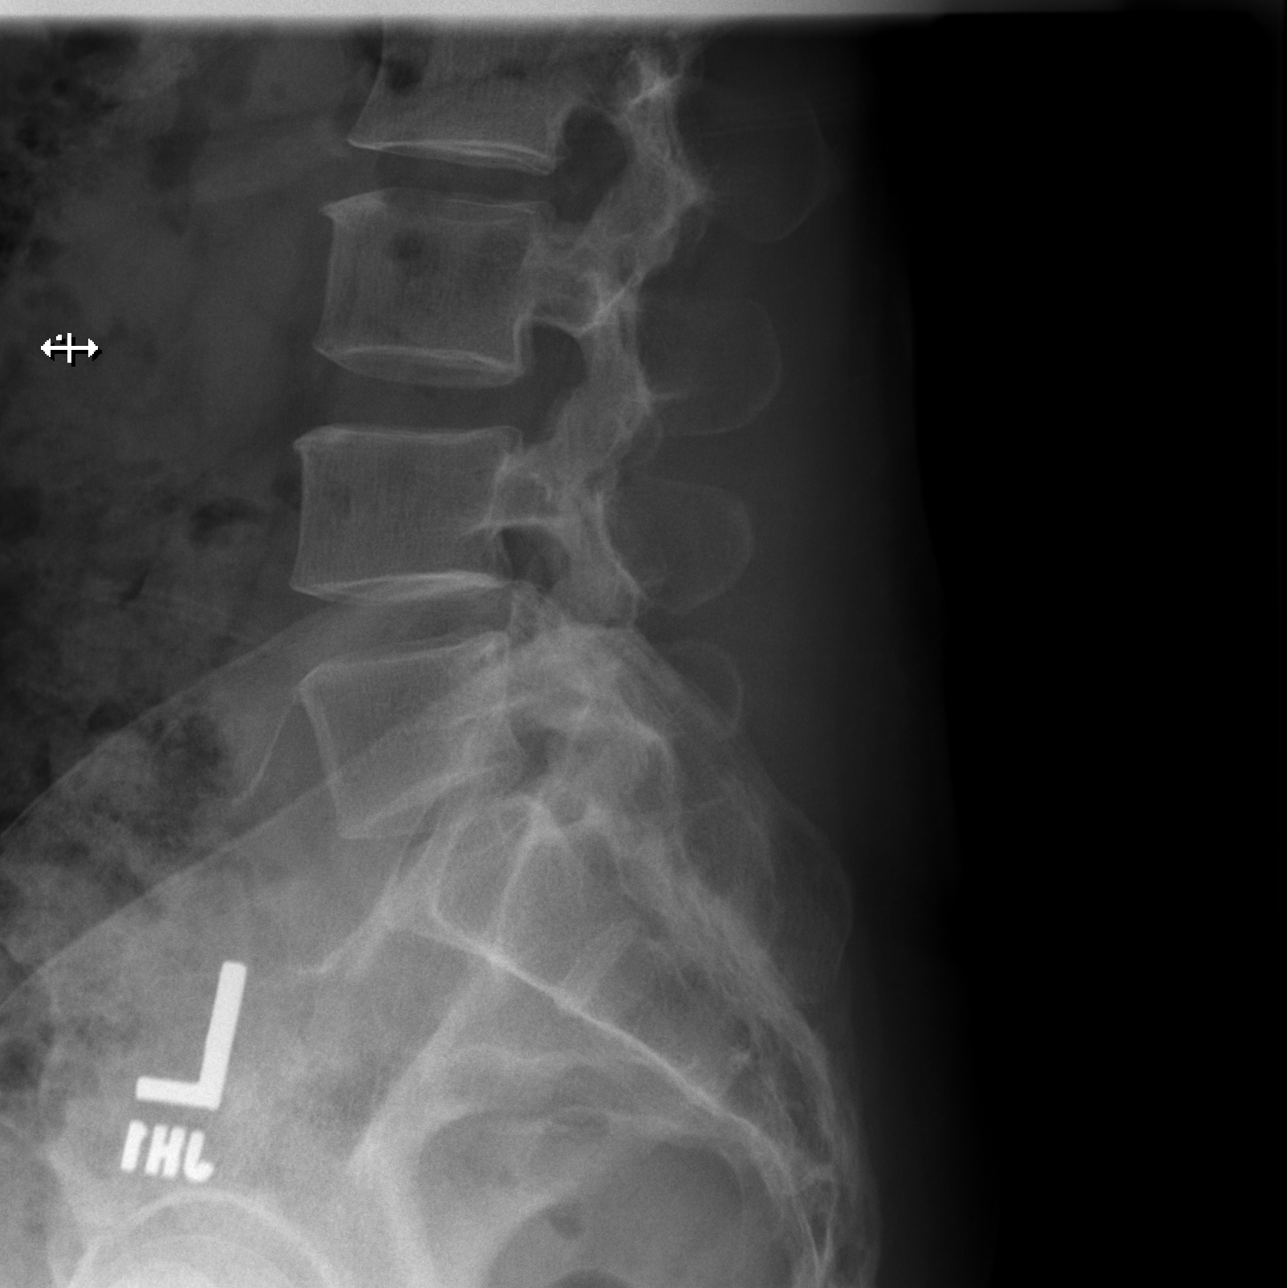

[5 of 5 positions shown; findings below may reference images not displayed]

FINDINGS: There is no evidence of lumbar spine fracture. Alignment is normal.
Intervertebral disc spaces are maintained.
IMPRESSION: Negative.

## 2022-10-19 ENCOUNTER — Ambulatory Visit: Payer: Self-pay | Admitting: Family Medicine

## 2023-11-23 ENCOUNTER — Ambulatory Visit (INDEPENDENT_AMBULATORY_CARE_PROVIDER_SITE_OTHER): Payer: Self-pay | Admitting: Family Medicine

## 2023-11-23 VITALS — BP 176/95 | HR 64 | Wt 119.6 lb

## 2023-11-23 DIAGNOSIS — L989 Disorder of the skin and subcutaneous tissue, unspecified: Secondary | ICD-10-CM

## 2023-11-23 DIAGNOSIS — Z1231 Encounter for screening mammogram for malignant neoplasm of breast: Secondary | ICD-10-CM

## 2023-11-23 DIAGNOSIS — Z1211 Encounter for screening for malignant neoplasm of colon: Secondary | ICD-10-CM | POA: Diagnosis not present

## 2023-11-23 DIAGNOSIS — I10 Essential (primary) hypertension: Secondary | ICD-10-CM

## 2023-11-23 DIAGNOSIS — Z0000 Encounter for general adult medical examination without abnormal findings: Secondary | ICD-10-CM

## 2023-11-23 MED ORDER — AMLODIPINE BESYLATE 5 MG PO TABS: 5.0000 mg | ORAL_TABLET | Freq: Every day | ORAL | 0 refills | Status: DC

## 2023-11-23 NOTE — Progress Notes (Addendum)
    SUBJECTIVE:   CHIEF COMPLAINT / HPI:   Itchy rash on leg Rash has been present for a long time, at least a year. No pain, just itchy. Has been spreading up from the ankle. Her son has a possibly similar rash on his leg. No bleeding or pus. No fevers. Feels it all started when she touched poison ivy, but it has not went away. She has tried OTC anti-itch cream.   BP Has been out of amlodipine  5 mg daily now for a long time.  PERTINENT  PMH / PSH: ***  OBJECTIVE:   LMP 07/29/2015   General: Alert and oriented, in NAD Skin: Warm, dry, and intact without lesions HEENT: NCAT, EOM grossly normal, midline nasal septum Cardiac: RRR, no m/r/g appreciated Respiratory: CTAB, breathing and speaking comfortably on RA Abdominal: Soft, nontender, nondistended, normoactive bowel sounds Extremities: Moves all extremities grossly equally Neurological: No gross focal deficit Psychiatric: Appropriate mood and affect   ASSESSMENT/PLAN:   Assessment & Plan      Stuart Redo, MD [facility]

## 2023-11-23 NOTE — Patient Instructions (Signed)
 I have refilled your amlodipine . Be sure to take this as soon as you can today.  Come back in 1 week to also have a biopsy of the skin done. You can schedule this up front.  I have added orders for a mammogram and cologuard (sample to send to your house).

## 2023-11-24 NOTE — Assessment & Plan Note (Signed)
 Screening mammogram ordered.  She was instructed she could obtain this without an appointment.  Cologuard ordered and will be shipped to her house for completion.

## 2023-11-24 NOTE — Assessment & Plan Note (Signed)
 Uncontrolled and has not been taking medications. Reassuring exam today. Sent in amlodipine  5 mg daily. Stressed importance of taking this. Recheck at biopsy visit in about 1 week.

## 2023-11-30 ENCOUNTER — Ambulatory Visit: Admitting: Family Medicine

## 2023-11-30 VITALS — BP 124/70 | HR 73 | Temp 98.2°F | Wt 121.4 lb

## 2023-11-30 DIAGNOSIS — L989 Disorder of the skin and subcutaneous tissue, unspecified: Secondary | ICD-10-CM | POA: Diagnosis not present

## 2023-11-30 NOTE — Progress Notes (Signed)
    SUBJECTIVE:   CHIEF COMPLAINT / HPI:   Leg lesion biopsy She presents for follow up to get a biopsy of lower leg lesion as described in my prior note. She has never been told she has bad blood flow to her legs. She is not on blood thinners.  OBJECTIVE:   BP 124/70   Pulse 73   Temp 98.2 F (36.8 C)   Wt 121 lb 6.4 oz (55.1 kg)   LMP 07/29/2015   SpO2 97%   BMI 25.37 kg/m   General: Alert and oriented, in NAD Skin: Warm, dry, and intact;  hyperkeratotic and hyperpigmented papules and plaques along left lower anterior extremity with areas of hypopigmentation though without bleeding, discharge, skin breakdown, scaling, tenderness to palpation (see prior photo) HEENT: NCAT, EOM grossly normal, midline nasal septum Respiratory: Breathing and speaking comfortably on RA Extremities: Moves all extremities grossly equally, LLE WWP with good DP pulse Neurological: No gross focal deficit Psychiatric: Appropriate mood and affect     ASSESSMENT/PLAN:   Assessment & Plan Skin lesion of left leg Pre-op Diagnosis: Skin lesion Post-op Diagnosis: Same Procedure: Punch Skin Biopsy Location: Left lower extremity Performing Physician: Stuart Redo, MD Supervising Physician (if applicable): Donald Lai, DO  Informed consent was obtained prior to the procedure. Time out performed. The area surrounding the skin lesion was prepared and cleaned with povidone-iodine swab stick and wiped clean with alcohol prep. The area was sufficiently anesthetized with 1% Lidocaine  with epinephrine .  A size 3mm disposable biopsy punch tool was used to obtain tissue sample and placed in labeled biopsy cup. Mild pressure was used to obtain hemostasis. The site was not closed; a steri-strip was applied to help approximate the lesion, and a bandage was applied over the steri-strip. The patient tolerated the procedure well without complications.  Standard post-procedure care was explained and return precautions and  wound care handout given.     Stuart Redo, MD [facility]

## 2023-11-30 NOTE — Patient Instructions (Signed)
 We collected a biopsy today. Once I have the results back, I will let you know. Keep me updated if you notice increasing bleeding, redness, pain, fevers, or discharge from the site.

## 2023-12-07 LAB — DERMATOLOGY PATHOLOGY

## 2023-12-07 LAB — COLOGUARD: COLOGUARD: NEGATIVE

## 2023-12-08 ENCOUNTER — Encounter: Payer: Self-pay | Admitting: Family Medicine

## 2023-12-08 ENCOUNTER — Ambulatory Visit: Payer: Self-pay | Admitting: Family Medicine

## 2023-12-08 DIAGNOSIS — L280 Lichen simplex chronicus: Secondary | ICD-10-CM | POA: Insufficient documentation

## 2023-12-08 MED ORDER — BETAMETHASONE VALERATE 0.1 % EX OINT: 1.0000 | TOPICAL_OINTMENT | Freq: Two times a day (BID) | CUTANEOUS | 0 refills | Status: AC

## 2023-12-08 NOTE — Progress Notes (Signed)
 Spoke with patient's daughter about biopsy results and negative cologuard. Will send in betamethasone  valerate 0.1% ointment to be applied BID for up to 14 days. We discussed skin lightening and thinning as side effects. They will stop therapy and call me if she experiences these. Also discussed next cologuard in 3 years.

## 2023-12-30 ENCOUNTER — Other Ambulatory Visit: Payer: Self-pay | Admitting: Family Medicine
